# Patient Record
Sex: Female | Born: 1950 | Race: Black or African American | Hispanic: No | Marital: Married
Health system: Southern US, Community
[De-identification: ages and names within clinical notes are randomized; demographics above are authoritative.]

---

## 2017-07-15 ENCOUNTER — Other Ambulatory Visit (HOSPITAL_COMMUNITY): Payer: Medicaid Other

## 2017-07-15 ENCOUNTER — Inpatient Hospital Stay
Admission: RE | Admit: 2017-07-15 | Discharge: 2017-08-11 | Disposition: E | Payer: Medicaid Other | Attending: Internal Medicine | Admitting: Internal Medicine

## 2017-07-15 DIAGNOSIS — K859 Acute pancreatitis without necrosis or infection, unspecified: Secondary | ICD-10-CM

## 2017-07-15 DIAGNOSIS — Z431 Encounter for attention to gastrostomy: Secondary | ICD-10-CM

## 2017-07-15 DIAGNOSIS — R059 Cough, unspecified: Secondary | ICD-10-CM

## 2017-07-15 DIAGNOSIS — R609 Edema, unspecified: Secondary | ICD-10-CM

## 2017-07-15 DIAGNOSIS — Z4659 Encounter for fitting and adjustment of other gastrointestinal appliance and device: Secondary | ICD-10-CM

## 2017-07-15 DIAGNOSIS — J9690 Respiratory failure, unspecified, unspecified whether with hypoxia or hypercapnia: Secondary | ICD-10-CM

## 2017-07-15 DIAGNOSIS — L0291 Cutaneous abscess, unspecified: Secondary | ICD-10-CM

## 2017-07-15 DIAGNOSIS — R05 Cough: Secondary | ICD-10-CM

## 2017-07-15 DIAGNOSIS — Z931 Gastrostomy status: Secondary | ICD-10-CM

## 2017-07-15 LAB — BLOOD GAS, ARTERIAL
ACID-BASE EXCESS: 1.4 mmol/L (ref 0.0–2.0)
BICARBONATE: 24.6 mmol/L (ref 20.0–28.0)
O2 CONTENT: 3 L/min
O2 Saturation: 99 %
PCO2 ART: 33.1 mmHg (ref 32.0–48.0)
Patient temperature: 98.6
pH, Arterial: 7.484 — ABNORMAL HIGH (ref 7.350–7.450)
pO2, Arterial: 153 mmHg — ABNORMAL HIGH (ref 83.0–108.0)

## 2017-07-15 LAB — PROTIME-INR
INR: 1.27
Prothrombin Time: 15.8 seconds — ABNORMAL HIGH (ref 11.4–15.2)

## 2017-07-16 ENCOUNTER — Institutional Professional Consult (permissible substitution) (HOSPITAL_COMMUNITY): Payer: Medicaid Other

## 2017-07-16 LAB — CBC WITH DIFFERENTIAL/PLATELET
Abs Immature Granulocytes: 0.1 10*3/uL (ref 0.0–0.1)
BASOS ABS: 0 10*3/uL (ref 0.0–0.1)
BASOS PCT: 0 %
EOS PCT: 0 %
Eosinophils Absolute: 0 10*3/uL (ref 0.0–0.7)
HCT: 36.6 % (ref 36.0–46.0)
Hemoglobin: 12.5 g/dL (ref 12.0–15.0)
Immature Granulocytes: 1 %
Lymphocytes Relative: 3 %
Lymphs Abs: 0.4 10*3/uL — ABNORMAL LOW (ref 0.7–4.0)
MCH: 29.5 pg (ref 26.0–34.0)
MCHC: 34.2 g/dL (ref 30.0–36.0)
MCV: 86.3 fL (ref 78.0–100.0)
MONO ABS: 0.6 10*3/uL (ref 0.1–1.0)
MONOS PCT: 4 %
Neutro Abs: 11.9 10*3/uL — ABNORMAL HIGH (ref 1.7–7.7)
Neutrophils Relative %: 92 %
PLATELETS: 99 10*3/uL — AB (ref 150–400)
RBC: 4.24 MIL/uL (ref 3.87–5.11)
RDW: 19.9 % — ABNORMAL HIGH (ref 11.5–15.5)
WBC: 13.1 10*3/uL — ABNORMAL HIGH (ref 4.0–10.5)

## 2017-07-16 LAB — COMPREHENSIVE METABOLIC PANEL
ALBUMIN: 2.8 g/dL — AB (ref 3.5–5.0)
ALT: 19 U/L (ref 0–44)
ANION GAP: 18 — AB (ref 5–15)
AST: 49 U/L — ABNORMAL HIGH (ref 15–41)
Alkaline Phosphatase: 60 U/L (ref 38–126)
BILIRUBIN TOTAL: 3.7 mg/dL — AB (ref 0.3–1.2)
BUN: 53 mg/dL — ABNORMAL HIGH (ref 8–23)
CO2: 22 mmol/L (ref 22–32)
Calcium: 8.4 mg/dL — ABNORMAL LOW (ref 8.9–10.3)
Chloride: 103 mmol/L (ref 98–111)
Creatinine, Ser: 4.7 mg/dL — ABNORMAL HIGH (ref 0.44–1.00)
GFR calc Af Amer: 10 mL/min — ABNORMAL LOW (ref >60)
GFR calc non Af Amer: 9 mL/min — ABNORMAL LOW (ref >60)
GLUCOSE: 88 mg/dL (ref 70–99)
POTASSIUM: 5.8 mmol/L — AB (ref 3.5–5.1)
SODIUM: 143 mmol/L (ref 135–145)
TOTAL PROTEIN: 6 g/dL — AB (ref 6.5–8.1)

## 2017-07-16 LAB — LIPASE, BLOOD: LIPASE: 369 U/L — AB (ref 11–51)

## 2017-07-16 LAB — TRIGLYCERIDES: Triglycerides: 122 mg/dL (ref <150)

## 2017-07-16 LAB — HEMOGLOBIN A1C
Hgb A1c MFr Bld: 5.8 % — ABNORMAL HIGH (ref 4.8–5.6)
MEAN PLASMA GLUCOSE: 119.76 mg/dL

## 2017-07-16 LAB — C DIFFICILE QUICK SCREEN W PCR REFLEX
C DIFFICILE (CDIFF) TOXIN: NEGATIVE
C Diff antigen: POSITIVE — AB

## 2017-07-16 LAB — CLOSTRIDIUM DIFFICILE BY PCR, REFLEXED: Toxigenic C. Difficile by PCR: POSITIVE — AB

## 2017-07-16 LAB — TSH: TSH: 1.328 u[IU]/mL (ref 0.350–4.500)

## 2017-07-16 LAB — T4, FREE: FREE T4: 0.95 ng/dL (ref 0.82–1.77)

## 2017-07-16 LAB — AMYLASE: Amylase: 641 U/L — ABNORMAL HIGH (ref 28–100)

## 2017-07-16 LAB — POTASSIUM: POTASSIUM: 5.5 mmol/L — AB (ref 3.5–5.1)

## 2017-07-17 LAB — RENAL FUNCTION PANEL
ALBUMIN: 2.5 g/dL — AB (ref 3.5–5.0)
Anion gap: 13 (ref 5–15)
BUN: 64 mg/dL — AB (ref 8–23)
CO2: 26 mmol/L (ref 22–32)
CREATININE: 5.56 mg/dL — AB (ref 0.44–1.00)
Calcium: 8.2 mg/dL — ABNORMAL LOW (ref 8.9–10.3)
Chloride: 104 mmol/L (ref 98–111)
GFR calc Af Amer: 8 mL/min — ABNORMAL LOW (ref >60)
GFR calc non Af Amer: 7 mL/min — ABNORMAL LOW (ref >60)
GLUCOSE: 96 mg/dL (ref 70–99)
PHOSPHORUS: 5.4 mg/dL — AB (ref 2.5–4.6)
Potassium: 4.6 mmol/L (ref 3.5–5.1)
SODIUM: 143 mmol/L (ref 135–145)

## 2017-07-17 LAB — CBC
HCT: 34 % — ABNORMAL LOW (ref 36.0–46.0)
Hemoglobin: 11.1 g/dL — ABNORMAL LOW (ref 12.0–15.0)
MCH: 28.2 pg (ref 26.0–34.0)
MCHC: 32.6 g/dL (ref 30.0–36.0)
MCV: 86.3 fL (ref 78.0–100.0)
PLATELETS: 137 10*3/uL — AB (ref 150–400)
RBC: 3.94 MIL/uL (ref 3.87–5.11)
RDW: 20.4 % — AB (ref 11.5–15.5)
WBC: 13 10*3/uL — ABNORMAL HIGH (ref 4.0–10.5)

## 2017-07-17 LAB — MAGNESIUM: MAGNESIUM: 2 mg/dL (ref 1.7–2.4)

## 2017-07-18 LAB — CBC
HEMATOCRIT: 34.3 % — AB (ref 36.0–46.0)
HEMOGLOBIN: 11.5 g/dL — AB (ref 12.0–15.0)
MCH: 28.9 pg (ref 26.0–34.0)
MCHC: 33.5 g/dL (ref 30.0–36.0)
MCV: 86.2 fL (ref 78.0–100.0)
Platelets: 147 10*3/uL — ABNORMAL LOW (ref 150–400)
RBC: 3.98 MIL/uL (ref 3.87–5.11)
RDW: 19.8 % — ABNORMAL HIGH (ref 11.5–15.5)
WBC: 12.5 10*3/uL — ABNORMAL HIGH (ref 4.0–10.5)

## 2017-07-18 LAB — RENAL FUNCTION PANEL
ANION GAP: 15 (ref 5–15)
Albumin: 2.6 g/dL — ABNORMAL LOW (ref 3.5–5.0)
BUN: 30 mg/dL — ABNORMAL HIGH (ref 8–23)
CHLORIDE: 98 mmol/L (ref 98–111)
CO2: 25 mmol/L (ref 22–32)
Calcium: 8.8 mg/dL — ABNORMAL LOW (ref 8.9–10.3)
Creatinine, Ser: 4.43 mg/dL — ABNORMAL HIGH (ref 0.44–1.00)
GFR calc non Af Amer: 9 mL/min — ABNORMAL LOW (ref >60)
GFR, EST AFRICAN AMERICAN: 11 mL/min — AB (ref >60)
GLUCOSE: 83 mg/dL (ref 70–99)
POTASSIUM: 3.9 mmol/L (ref 3.5–5.1)
Phosphorus: 4.3 mg/dL (ref 2.5–4.6)
Sodium: 138 mmol/L (ref 135–145)

## 2017-07-18 LAB — HEPATITIS B SURFACE ANTIBODY,QUALITATIVE: Hep B S Ab: REACTIVE

## 2017-07-18 LAB — HEPATITIS B CORE ANTIBODY, TOTAL: HEP B C TOTAL AB: NEGATIVE

## 2017-07-18 LAB — HEPATITIS B SURFACE ANTIGEN: HEP B S AG: NEGATIVE

## 2017-07-18 MED ORDER — GENERIC EXTERNAL MEDICATION
5.00 | Status: DC
Start: ? — End: 2017-07-18

## 2017-07-18 MED ORDER — GENERIC EXTERNAL MEDICATION
1.00 | Status: DC
Start: ? — End: 2017-07-18

## 2017-07-18 MED ORDER — GENERIC EXTERNAL MEDICATION
5.00 | Status: DC
Start: 2017-07-15 — End: 2017-07-18

## 2017-07-18 MED ORDER — BISACODYL 5 MG PO TBEC
10.00 | DELAYED_RELEASE_TABLET | ORAL | Status: DC
Start: ? — End: 2017-07-18

## 2017-07-18 MED ORDER — GENERIC EXTERNAL MEDICATION
2.25 g | Status: DC
Start: 2017-07-15 — End: 2017-07-18

## 2017-07-18 MED ORDER — ACETAMINOPHEN 325 MG PO TABS
650.00 | ORAL_TABLET | ORAL | Status: DC
Start: ? — End: 2017-07-18

## 2017-07-18 MED ORDER — ONDANSETRON HCL 4 MG/2ML IJ SOLN
4.00 | INTRAMUSCULAR | Status: DC
Start: ? — End: 2017-07-18

## 2017-07-18 MED ORDER — DEXTROSE 50 % IV SOLN
12.00 g | INTRAVENOUS | Status: DC
Start: ? — End: 2017-07-18

## 2017-07-18 MED ORDER — GLUCOSE 40 % PO GEL
15.00 g | ORAL | Status: DC
Start: ? — End: 2017-07-18

## 2017-07-18 MED ORDER — DOCUSATE SODIUM 100 MG PO CAPS
100.00 | ORAL_CAPSULE | ORAL | Status: DC
Start: ? — End: 2017-07-18

## 2017-07-18 MED ORDER — FENTANYL CITRATE (PF) 50 MCG/ML IJ SOLN
25.00 | INTRAMUSCULAR | Status: DC
Start: ? — End: 2017-07-18

## 2017-07-18 MED ORDER — LEVETIRACETAM 250 MG PO TABS
250.00 | ORAL_TABLET | ORAL | Status: DC
Start: 2017-07-15 — End: 2017-07-18

## 2017-07-18 MED ORDER — LEVOTHYROXINE SODIUM 50 MCG PO TABS
50.00 | ORAL_TABLET | ORAL | Status: DC
Start: 2017-07-16 — End: 2017-07-18

## 2017-07-18 MED ORDER — HYDROCORTISONE NA SUCCINATE PF 100 MG IJ SOLR
50.00 | INTRAMUSCULAR | Status: DC
Start: 2017-07-16 — End: 2017-07-18

## 2017-07-18 NOTE — Consult Note (Signed)
[facility] CONSULT NOTE    Date: 07/18/2017                  Patient Name:  Sara Alexander  MRN: [id number]  DOB: April 25, 1950  Age / Sex: 67 y.o., female         PCP: Default, Provider, MD                 Service Requesting Consult: Hospitalist                 Reason for Consult: Evaluation and management of ESRD            History of Present Illness: Patient is a 67 y.o. female with a PMHx of ESRD on HD TTS, hypertension, pancreatitis, hypoxic ischemic encephalopathy, ischemic cardiomyopathy ejection fraction 25%, severe aortic stenosis, coronary artery disease, recent acute respiratory failure requiring temporary mechanical ventilation, who was [facility] care.  She was recently [facility] to [facility] on July 05, 2017 and discharged on July 15, 2017.  She was admitted for mild sigmoid diverticulitis.  She was originally admitted and started on ciprofloxacin and Flagyl.  She subsequently had a lactic acid that was high at 8.8.  She was started on IV fluid hydration.  Unfortunately she suffered a PEA arrest on July 05, 2017.  She was resuscitated for approximately 10 minutes and then transferred to the intensive care unit.  EEG was performed while she was in the intensive care which showed generalized slowing without evidence of seizure.  Thereafter her mentation slightly improved.  She was then transferred here for ongoing care.  Patient did receive dialysis yesterday.   Medications: Carvedilol 6.25 mg twice daily, levothyroxine 50 mcg daily, discharge medications: Zosyn 2.25 g IV every 8 hours    Allergies: No known drug allergies   Past Medical History:  ESRD on HD TTS, hypertension, pancreatitis, hypoxic ischemic encephalopathy, ischemic cardiomyopathy ejection fraction 25%, severe aortic stenosis, coronary artery disease, recent acute respiratory failure requiring temporary mechanical  ventilation  Past Surgical History: Catheter insertion, pericardial window, breast biopsy, hysterectomy, colonoscopy  Family History: Unable to obtain from patient given altered mental status.  Social History: Unable to obtain from patient given altered mental status  Review of Systems: Unable to obtain from patient given altered mental status  Vital Signs: Temperature 98.6 pulse 89 respirations 16 blood pressure 114/68  Physical Exam: General: NAD, laying in bed  Head: Normocephalic, atraumatic.  Eyes: Anicteric, EOMI  Nose: Mucous membranes moist, not inflammed, nonerythematous.  Throat: Oropharynx nonerythematous, no exudate appreciated.   Neck: Supple, trachea midline.  Lungs:  Normal respiratory effort. Clear to auscultation BL without crackles or wheezes.  Heart: S1S2 2/6 SEM.  Abdomen:  BS normoactive. Soft, Nondistended, non-tender.  No masses or organomegaly.  Extremities: trace pretibial edema.  Neurologic: Awake but not following commands  Skin: No visible rashes, scars.    Lab results: Basic Metabolic Panel: Recent Labs  Lab 07/16/17 0917 07/16/17 2210 07/17/17 1024 07/17/17 1254  NA 143  --  143  --   K 5.8* 5.5* 4.6  --   CL 103  --  104  --   CO2 22  --  26  --   GLUCOSE 88  --  96  --   BUN 53*  --  64*  --   CREATININE 4.70*  --  5.56*  --   CALCIUM 8.4*  --  8.2*  --  MG  --   --   --  2.0  PHOS  --   --  5.4*  --     Liver Function Tests: Recent Labs  Lab 07/16/17 0917 07/17/17 1024  AST 49*  --   ALT 19  --   ALKPHOS 60  --   BILITOT 3.7*  --   PROT 6.0*  --   ALBUMIN 2.8* 2.5*   Recent Labs  Lab 07/16/17 0534  LIPASE 369*  AMYLASE 641*   No results for input(s): AMMONIA in the last 168 hours.  CBC: Recent Labs  Lab 07/16/17 0534 07/17/17 0850  WBC 13.1* 13.0*  NEUTROABS 11.9*  --   HGB 12.5 11.1*  HCT 36.6 34.0*  MCV 86.3 86.3  PLT 99* 137*    Cardiac Enzymes: No results for input(s): CKTOTAL, CKMB, CKMBINDEX,  TROPONINI in the last 168 hours.  BNP: Invalid input(s): POCBNP  CBG: No results for input(s): GLUCAP in the last 168 hours.  Microbiology: Results for orders placed or performed during the hospital encounter of 07/23/2017  C difficile quick scan w PCR reflex     Status: Abnormal   Collection Time: 08/10/2017  5:57 AM  Result Value Ref Range Status   C Diff antigen POSITIVE (A) NEGATIVE Final   C Diff toxin NEGATIVE NEGATIVE Final   C Diff interpretation Results are indeterminate. See PCR results.  Final    Comment: Performed at [facility], [address] [place], [place] [address]  C. Diff by PCR, Reflexed     Status: Abnormal   Collection Time: 08/03/2017  5:57 AM  Result Value Ref Range Status   Toxigenic C. Difficile by PCR POSITIVE (A) NEGATIVE Final    Comment: Positive for toxigenic C. difficile with little to no toxin production. Only treat if clinical presentation suggests symptomatic illness. Performed at [facility], [address] [place], [place] [address]     Coagulation Studies: Recent Labs    07/26/2017 06-12-10  LABPROT 15.8*  INR 1.27    Urinalysis: No results for input(s): COLORURINE, LABSPEC, PHURINE, GLUCOSEU, HGBUR, BILIRUBINUR, KETONESUR, PROTEINUR, UROBILINOGEN, NITRITE, LEUKOCYTESUR in the last 72 hours.  Invalid input(s): APPERANCEUR    Imaging:  No results found.   Assessment & Plan: Pt is a 67 y.o. female with a PMHx of ESRD on HD TTS, hypertension, pancreatitis, hypoxic ischemic encephalopathy due to PEA cardiac arrest, ischemic cardiomyopathy ejection fraction 25%, severe aortic stenosis, coronary artery disease, recent acute respiratory failure requiring temporary mechanical ventilation, who was [facility] care.  1.  ESRD on HD.  Patient was on dialysis on TTS as an outpatient.  We will switch her to a Monday, 06-12-2022, Friday schedule for nurse scheduling purposes.  She did have some  dialysis yesterday.  We will plan for a short dialysis treatment today and then her next treatment will be on 06-12-2022.  2.  Anemia of chronic kidney disease.  Hemoglobin currently 11.1.  Hold off on Epogen at this time.  3.  Secondary hyperparathyroidism.  We will plan to obtain PTH, phosphorus, calcium values over the course of his hospitalization.  4.  Recent PEA arrest with ischemic hypoxic encephalopathy.  Per records from [facility] appears in the patient has shown some improvement.  We will continue to monitor mental status.

## 2017-07-19 ENCOUNTER — Institutional Professional Consult (permissible substitution) (HOSPITAL_COMMUNITY): Payer: Medicaid Other

## 2017-07-19 LAB — MAGNESIUM: MAGNESIUM: 1.5 mg/dL — AB (ref 1.7–2.4)

## 2017-07-19 LAB — AMYLASE: Amylase: 227 U/L — ABNORMAL HIGH (ref 28–100)

## 2017-07-19 LAB — LIPASE, BLOOD: Lipase: 135 U/L — ABNORMAL HIGH (ref 11–51)

## 2017-07-19 LAB — HEPATITIS B SURFACE ANTIBODY,QUALITATIVE: HEP B S AB: REACTIVE

## 2017-07-19 LAB — HEPATITIS B SURFACE ANTIGEN: HEP B S AG: NEGATIVE

## 2017-07-19 LAB — PTH, INTACT AND CALCIUM
Calcium, Total (PTH): 8.8 mg/dL (ref 8.7–10.3)
PTH: 466 pg/mL — ABNORMAL HIGH (ref 15–65)

## 2017-07-19 LAB — HEPATITIS B CORE ANTIBODY, TOTAL: HEP B C TOTAL AB: NEGATIVE

## 2017-07-20 LAB — RENAL FUNCTION PANEL
Albumin: 2.4 g/dL — ABNORMAL LOW (ref 3.5–5.0)
Anion gap: 14 (ref 5–15)
BUN: 29 mg/dL — ABNORMAL HIGH (ref 8–23)
CO2: 23 mmol/L (ref 22–32)
Calcium: 8.6 mg/dL — ABNORMAL LOW (ref 8.9–10.3)
Chloride: 96 mmol/L — ABNORMAL LOW (ref 98–111)
Creatinine, Ser: 4.76 mg/dL — ABNORMAL HIGH (ref 0.44–1.00)
GFR calc Af Amer: 10 mL/min — ABNORMAL LOW
GFR calc non Af Amer: 9 mL/min — ABNORMAL LOW
Glucose, Bld: 234 mg/dL — ABNORMAL HIGH (ref 70–99)
Phosphorus: 3.9 mg/dL (ref 2.5–4.6)
Potassium: 3.5 mmol/L (ref 3.5–5.1)
Sodium: 133 mmol/L — ABNORMAL LOW (ref 135–145)

## 2017-07-20 LAB — CBC
HEMATOCRIT: 34.9 % — AB (ref 36.0–46.0)
Hemoglobin: 11.8 g/dL — ABNORMAL LOW (ref 12.0–15.0)
MCH: 28.8 pg (ref 26.0–34.0)
MCHC: 33.8 g/dL (ref 30.0–36.0)
MCV: 85.1 fL (ref 78.0–100.0)
Platelets: 159 10*3/uL (ref 150–400)
RBC: 4.1 MIL/uL (ref 3.87–5.11)
RDW: 20.2 % — ABNORMAL HIGH (ref 11.5–15.5)
WBC: 10.8 10*3/uL — ABNORMAL HIGH (ref 4.0–10.5)

## 2017-07-20 LAB — AMYLASE: Amylase: 202 U/L — ABNORMAL HIGH (ref 28–100)

## 2017-07-20 LAB — MAGNESIUM: Magnesium: 2.4 mg/dL (ref 1.7–2.4)

## 2017-07-20 LAB — LIPASE, BLOOD: Lipase: 144 U/L — ABNORMAL HIGH (ref 11–51)

## 2017-07-20 NOTE — Progress Notes (Signed)
[facility]  ROUNDING NOTE   Subjective:  Patient completed hemodialysis today. Tolerated well. Family at bedside.   Objective:  Vital signs in last 24 hours:  Temperature 97.4 pulse 87 respirations 30 blood pressure 131/63  Physical Exam: General: No acute distress  Head: Normocephalic, atraumatic. Moist oral mucosal membranes  Eyes: Anicteric  Neck: Supple, trachea midline  Lungs:  Clear to auscultation, normal effort  Heart: S1S2 no rubs  Abdomen:  Soft, nontender, bowel sounds present  Extremities: Trace peripheral edema.  Neurologic: Awake, moving upper extremeties, but not following commands  Skin: No rashes  Access: R IJ PC    Basic Metabolic Panel: Recent Labs  Lab 07/16/17 0917 07/16/17 2210 07/17/17 1024 07/17/17 1254 07/18/17 1800 07/19/17 0701 07/20/17 0810  NA 143  --  143  --  138  --  133*  K 5.8* 5.5* 4.6  --  3.9  --  3.5  CL 103  --  104  --  98  --  96*  CO2 22  --  26  --  25  --  23  GLUCOSE 88  --  96  --  83  --  234*  BUN 53*  --  64*  --  30*  --  29*  CREATININE 4.70*  --  5.56*  --  4.43*  --  4.76*  CALCIUM 8.4*  --  8.2*  --  8.8*  8.8  --  8.6*  MG  --   --   --  2.0  --  1.5* 2.4  PHOS  --   --  5.4*  --  4.3  --  3.9    Liver Function Tests: Recent Labs  Lab 07/16/17 0917 07/17/17 1024 07/18/17 1800 07/20/17 0810  AST 49*  --   --   --   ALT 19  --   --   --   ALKPHOS 60  --   --   --   BILITOT 3.7*  --   --   --   PROT 6.0*  --   --   --   ALBUMIN 2.8* 2.5* 2.6* 2.4*   Recent Labs  Lab 07/16/17 0534 07/19/17 0701 07/20/17 0810  LIPASE 369* 135* 144*  AMYLASE 641* 227* 202*   No results for input(s): AMMONIA in the last 168 hours.  CBC: Recent Labs  Lab 07/16/17 0534 07/17/17 0850 07/18/17 1800 07/20/17 0620  WBC 13.1* 13.0* 12.5* 10.8*  NEUTROABS 11.9*  --   --   --   HGB 12.5 11.1* 11.5* 11.8*  HCT 36.6 34.0* 34.3* 34.9*  MCV 86.3 86.3 86.2 85.1  PLT 99* 137* 147* 159    Cardiac  Enzymes: No results for input(s): CKTOTAL, CKMB, CKMBINDEX, TROPONINI in the last 168 hours.  BNP: Invalid input(s): POCBNP  CBG: No results for input(s): GLUCAP in the last 168 hours.  Microbiology: Results for orders placed or performed during the hospital encounter of 07/22/2017  C difficile quick scan w PCR reflex     Status: Abnormal   Collection Time: 07/17/2017  5:57 AM  Result Value Ref Range Status   C Diff antigen POSITIVE (A) NEGATIVE Final   C Diff toxin NEGATIVE NEGATIVE Final   C Diff interpretation Results are indeterminate. See PCR results.  Final    Comment: Performed at [facility], [address], [place], [place] [address]  C. Diff by PCR, Reflexed     Status: Abnormal   Collection Time:  12-29-2017  5:57 AM  Result Value Ref Range Status   Toxigenic C. Difficile by PCR POSITIVE (A) NEGATIVE Final    Comment: Positive for toxigenic C. difficile with little to no toxin production. Only treat if clinical presentation suggests symptomatic illness. Performed at [facility], [address] [place], [place] [id number]     Coagulation Studies: No results for input(s): LABPROT, INR in the last 72 hours.  Urinalysis: No results for input(s): COLORURINE, LABSPEC, PHURINE, GLUCOSEU, HGBUR, BILIRUBINUR, KETONESUR, PROTEINUR, UROBILINOGEN, NITRITE, LEUKOCYTESUR in the last 72 hours.  Invalid input(s): APPERANCEUR    Imaging: Dg Abd Portable 1v  Result Date: 07/19/2017 CLINICAL DATA:  Esophagogastric tube placement. EXAM: PORTABLE ABDOMEN - 1 VIEW COMPARISON:  Abdominal radiograph of July 16, 2017 FINDINGS: An esophagogastric tube is present. Its tip in proximal port project in the gastric body. The bowel gas pattern is normal. IMPRESSION: Reasonable positioning of the esophagogastric tube. Electronically Signed   By: David  SwazilandJordan M.D.   On: 07/19/2017 12:26     Medications:       Assessment/ Plan:  67 y.o. female with a PMHx of ESRD on HD TTS,  hypertension, pancreatitis, hypoxic ischemic encephalopathy due to PEA cardiac arrest, ischemic cardiomyopathy ejection fraction 25%, severe aortic stenosis, coronary artery disease, recent acute respiratory failure requiring temporary mechanical ventilation, who was [facility].  1.  ESRD on HD.  Patient was on dialysis on TTS as an outpatient.   -Patient seen post dialysis.  She tolerated well.  Next dialysis on Friday.  We are continuing dialysis on MWF schedule here.  2.  Anemia of chronic kidney disease.    Hemoglobin currently 11.8.  Hold off on Epogen at this time.  3.  Secondary hyperparathyroidism.    Phosphorus 3.9 and acceptable.  4.  Recent PEA arrest with ischemic hypoxic encephalopathy.  Patient awake and moving upper extremities.  However not following commands.  Overall improved from her recent hospitalization however.     LOS: 0 Branndon Tuite 7/10/20194:52 PM

## 2017-07-22 LAB — CBC
HCT: 36.3 % (ref 36.0–46.0)
Hemoglobin: 12.1 g/dL (ref 12.0–15.0)
MCH: 28.9 pg (ref 26.0–34.0)
MCHC: 33.3 g/dL (ref 30.0–36.0)
MCV: 86.6 fL (ref 78.0–100.0)
PLATELETS: 173 10*3/uL (ref 150–400)
RBC: 4.19 MIL/uL (ref 3.87–5.11)
RDW: 19.9 % — ABNORMAL HIGH (ref 11.5–15.5)
WBC: 9.1 10*3/uL (ref 4.0–10.5)

## 2017-07-22 LAB — MAGNESIUM: Magnesium: 2.5 mg/dL — ABNORMAL HIGH (ref 1.7–2.4)

## 2017-07-22 LAB — LIPASE, BLOOD: Lipase: 178 U/L — ABNORMAL HIGH (ref 11–51)

## 2017-07-22 LAB — RENAL FUNCTION PANEL
Albumin: 3 g/dL — ABNORMAL LOW (ref 3.5–5.0)
Anion gap: 16 — ABNORMAL HIGH (ref 5–15)
BUN: 27 mg/dL — AB (ref 8–23)
CHLORIDE: 97 mmol/L — AB (ref 98–111)
CO2: 24 mmol/L (ref 22–32)
CREATININE: 4.53 mg/dL — AB (ref 0.44–1.00)
Calcium: 9.6 mg/dL (ref 8.9–10.3)
GFR calc Af Amer: 10 mL/min — ABNORMAL LOW (ref >60)
GFR calc non Af Amer: 9 mL/min — ABNORMAL LOW (ref >60)
Glucose, Bld: 128 mg/dL — ABNORMAL HIGH (ref 70–99)
PHOSPHORUS: 3.4 mg/dL (ref 2.5–4.6)
Potassium: 3.9 mmol/L (ref 3.5–5.1)
Sodium: 137 mmol/L (ref 135–145)

## 2017-07-22 LAB — AMYLASE: Amylase: 183 U/L — ABNORMAL HIGH (ref 28–100)

## 2017-07-22 LAB — AMMONIA: Ammonia: 26 umol/L (ref 9–35)

## 2017-07-22 NOTE — Progress Notes (Signed)
[facility]  ROUNDING NOTE   Subjective:  Patient seen and evaluated during hemodialysis. Tolerating well. She is currently awake and attempts to verbalize.Speech difficult to comprehend.    Objective:  Vital signs in last 24 hours:  Genetics and pulse 92 respirations 35 blood pressure 135/74  Physical Exam: General: No acute distress  Head: Normocephalic, atraumatic. Moist oral mucosal membranes  Eyes: Anicteric  Neck: Supple, trachea midline  Lungs:  Clear to auscultation, normal effort  Heart: S1S2 no rubs  Abdomen:  Soft, nontender, bowel sounds present  Extremities: Trace peripheral edema.  Neurologic: Awake, moving upper extremeties, but not following commands  Skin: No rashes  Access: R IJ PC    Basic Metabolic Panel: Recent Labs  Lab 07/16/17 0917 07/16/17 2210 07/17/17 1024 07/17/17 1254 07/18/17 1800 07/19/17 0701 07/20/17 0810 07/22/17 0404  NA 143  --  143  --  138  --  133* 137  K 5.8* 5.5* 4.6  --  3.9  --  3.5 3.9  CL 103  --  104  --  98  --  96* 97*  CO2 22  --  26  --  25  --  23 24  GLUCOSE 88  --  96  --  83  --  234* 128*  BUN 53*  --  64*  --  30*  --  29* 27*  CREATININE 4.70*  --  5.56*  --  4.43*  --  4.76* 4.53*  CALCIUM 8.4*  --  8.2*  --  8.8*  8.8  --  8.6* 9.6  MG  --   --   --  2.0  --  1.5* 2.4 2.5*  PHOS  --   --  5.4*  --  4.3  --  3.9 3.4    Liver Function Tests: Recent Labs  Lab 07/16/17 0917 07/17/17 1024 07/18/17 1800 07/20/17 0810 07/22/17 0404  AST 49*  --   --   --   --   ALT 19  --   --   --   --   ALKPHOS 60  --   --   --   --   BILITOT 3.7*  --   --   --   --   PROT 6.0*  --   --   --   --   ALBUMIN 2.8* 2.5* 2.6* 2.4* 3.0*   Recent Labs  Lab 07/16/17 0534 07/19/17 0701 07/20/17 0810 07/22/17 0404  LIPASE 369* 135* 144* 178*  AMYLASE 641* 227* 202* 183*   Recent Labs  Lab 07/22/17 0404  AMMONIA 26    CBC: Recent Labs  Lab 07/16/17 0534 07/17/17 0850 07/18/17 1800  07/20/17 0620 07/22/17 0404  WBC 13.1* 13.0* 12.5* 10.8* 9.1  NEUTROABS 11.9*  --   --   --   --   HGB 12.5 11.1* 11.5* 11.8* 12.1  HCT 36.6 34.0* 34.3* 34.9* 36.3  MCV 86.3 86.3 86.2 85.1 86.6  PLT 99* 137* 147* 159 173    Cardiac Enzymes: No results for input(s): CKTOTAL, CKMB, CKMBINDEX, TROPONINI in the last 168 hours.  BNP: Invalid input(s): POCBNP  CBG: No results for input(s): GLUCAP in the last 168 hours.  Microbiology: Results for orders placed or performed during the hospital encounter of 07/31/2017  C difficile quick scan w PCR reflex     Status: Abnormal   Collection Time: 07/11/2017  5:57 AM  Result Value Ref Range Status   C Diff antigen POSITIVE (A)  NEGATIVE Final   C Diff toxin NEGATIVE NEGATIVE Final   C Diff interpretation Results are indeterminate. See PCR results.  Final    Comment: Performed at [facility], [address], [place], [place] [address]  C. Diff by PCR, Reflexed     Status: Abnormal   Collection Time: 07/22/2017  5:57 AM  Result Value Ref Range Status   Toxigenic C. Difficile by PCR POSITIVE (A) NEGATIVE Final    Comment: Positive for toxigenic C. difficile with little to no toxin production. Only treat if clinical presentation suggests symptomatic illness. Performed at [facility], [address] [place], [place] [address]     Coagulation Studies: No results for input(s): LABPROT, INR in the last 72 hours.  Urinalysis: No results for input(s): COLORURINE, LABSPEC, PHURINE, GLUCOSEU, HGBUR, BILIRUBINUR, KETONESUR, PROTEINUR, UROBILINOGEN, NITRITE, LEUKOCYTESUR in the last 72 hours.  Invalid input(s): APPERANCEUR    Imaging: No results found.   Medications:       Assessment/ Plan:  67 y.o. female with a PMHx of ESRD on HD TTS, hypertension, pancreatitis, hypoxic ischemic encephalopathy due to PEA cardiac arrest, ischemic cardiomyopathy ejection fraction 25%, severe aortic stenosis, coronary artery disease, recent  acute respiratory failure requiring temporary mechanical ventilation, who was [facility].  1.  ESRD on HD.  Patient was on dialysis on TTS as an outpatient.   -Patient seen during dialysis.  Tolerating well.  We plan to complete dialysis today and next else is to be scheduled for Monday.  2.  Anemia of chronic kidney disease.   hemoglobin 12.1.  No indication for Procrit.  3.  Secondary hyperparathyroidism.    Phosphorus currently 3.4 and at target.  4.  Recent PEA arrest with ischemic hypoxic encephalopathy. Patient continues to have slow but steady neurologic improvement.  She was attempting to verbalize today but speech was difficult to comprehend.     LOS: 0 Ayeden Gladman 7/12/20194:24 PM

## 2017-07-24 ENCOUNTER — Other Ambulatory Visit (HOSPITAL_COMMUNITY): Payer: Medicaid Other

## 2017-07-25 LAB — RENAL FUNCTION PANEL
ANION GAP: 12 (ref 5–15)
Albumin: 2.5 g/dL — ABNORMAL LOW (ref 3.5–5.0)
BUN: 44 mg/dL — AB (ref 8–23)
CHLORIDE: 95 mmol/L — AB (ref 98–111)
CO2: 29 mmol/L (ref 22–32)
Calcium: 9.4 mg/dL (ref 8.9–10.3)
Creatinine, Ser: 5.1 mg/dL — ABNORMAL HIGH (ref 0.44–1.00)
GFR calc Af Amer: 9 mL/min — ABNORMAL LOW (ref >60)
GFR calc non Af Amer: 8 mL/min — ABNORMAL LOW (ref >60)
Glucose, Bld: 105 mg/dL — ABNORMAL HIGH (ref 70–99)
PHOSPHORUS: 3.4 mg/dL (ref 2.5–4.6)
POTASSIUM: 4.1 mmol/L (ref 3.5–5.1)
Sodium: 136 mmol/L (ref 135–145)

## 2017-07-25 LAB — CBC
HEMATOCRIT: 29.8 % — AB (ref 36.0–46.0)
HEMOGLOBIN: 10.1 g/dL — AB (ref 12.0–15.0)
MCH: 28.8 pg (ref 26.0–34.0)
MCHC: 33.9 g/dL (ref 30.0–36.0)
MCV: 84.9 fL (ref 78.0–100.0)
Platelets: 141 10*3/uL — ABNORMAL LOW (ref 150–400)
RBC: 3.51 MIL/uL — ABNORMAL LOW (ref 3.87–5.11)
RDW: 19 % — AB (ref 11.5–15.5)
WBC: 8 10*3/uL (ref 4.0–10.5)

## 2017-07-25 NOTE — Progress Notes (Signed)
[facility]  ROUNDING NOTE   Subjective:  Patient seen during dialysis. Tolerating well. Blood flow rate 400. Ultrafiltration target 1.5 kg.    Objective:  Vital signs in last 24 hours:  Temperature 97.1 pulse 87 respirations 32 blood pressure 104/57  Physical Exam: General: No acute distress  Head: Normocephalic, atraumatic. Moist oral mucosal membranes  Eyes: Anicteric  Neck: Supple, trachea midline  Lungs:  Clear to auscultation, normal effort  Heart: S1S2 no rubs  Abdomen:  Soft, nontender, bowel sounds present  Extremities: Trace peripheral edema.  Neurologic: Awake, moving upper extremeties, but not following commands  Skin: No rashes  Access: R IJ PC    Basic Metabolic Panel: Recent Labs  Lab 07/18/17 1800 07/19/17 0701 07/20/17 0810 07/22/17 0404 07/25/17 0500  NA 138  --  133* 137 136  K 3.9  --  3.5 3.9 4.1  CL 98  --  96* 97* 95*  CO2 25  --  23 24 29   GLUCOSE 83  --  234* 128* 105*  BUN 30*  --  29* 27* 44*  CREATININE 4.43*  --  4.76* 4.53* 5.10*  CALCIUM 8.8*  8.8  --  8.6* 9.6 9.4  MG  --  1.5* 2.4 2.5*  --   PHOS 4.3  --  3.9 3.4 3.4    Liver Function Tests: Recent Labs  Lab 07/18/17 1800 07/20/17 0810 07/22/17 0404 07/25/17 0500  ALBUMIN 2.6* 2.4* 3.0* 2.5*   Recent Labs  Lab 07/19/17 0701 07/20/17 0810 07/22/17 0404  LIPASE 135* 144* 178*  AMYLASE 227* 202* 183*   Recent Labs  Lab 07/22/17 0404  AMMONIA 26    CBC: Recent Labs  Lab 07/18/17 1800 07/20/17 0620 07/22/17 0404 07/25/17 0500  WBC 12.5* 10.8* 9.1 8.0  HGB 11.5* 11.8* 12.1 10.1*  HCT 34.3* 34.9* 36.3 29.8*  MCV 86.2 85.1 86.6 84.9  PLT 147* 159 173 141*    Cardiac Enzymes: No results for input(s): CKTOTAL, CKMB, CKMBINDEX, TROPONINI in the last 168 hours.  BNP: Invalid input(s): POCBNP  CBG: No results for input(s): GLUCAP in the last 168 hours.  Microbiology: Results for orders placed or performed during the hospital encounter of  07/24/2017  C difficile quick scan w PCR reflex     Status: Abnormal   Collection Time: 08/07/2017  5:57 AM  Result Value Ref Range Status   C Diff antigen POSITIVE (A) NEGATIVE Final   C Diff toxin NEGATIVE NEGATIVE Final   C Diff interpretation Results are indeterminate. See PCR results.  Final    Comment: Performed at [facility], [address] [place], [place] [address]  C. Diff by PCR, Reflexed     Status: Abnormal   Collection Time: 07/12/2017  5:57 AM  Result Value Ref Range Status   Toxigenic C. Difficile by PCR POSITIVE (A) NEGATIVE Final    Comment: Positive for toxigenic C. difficile with little to no toxin production. Only treat if clinical presentation suggests symptomatic illness. Performed at [facility], [address], [place], [place] [address]     Coagulation Studies: No results for input(s): LABPROT, INR in the last 72 hours.  Urinalysis: No results for input(s): COLORURINE, LABSPEC, PHURINE, GLUCOSEU, HGBUR, BILIRUBINUR, KETONESUR, PROTEINUR, UROBILINOGEN, NITRITE, LEUKOCYTESUR in the last 72 hours.  Invalid input(s): APPERANCEUR    Imaging: [place] Chest Soft Tissue  Result Date: 07/24/2017 CLINICAL DATA:  Left chest lump. Negative left breast biopsy 4-6 months ago. EXAM: ULTRASOUND OF CHEST  SOFT TISSUES TECHNIQUE: Ultrasound examination of the chest wall soft tissues was performed in the area of clinical concern. COMPARISON:  None. FINDINGS: Focused ultrasound in the left anterolateral upper chest wall along the pre axillary line demonstrates a large 6.0 x 4.8 x 2.0 cm heterogeneous fluid collection with areas of internal hypoechogenicity next with anechoic fluid and multiple punctate linear internal echoes. No internal vascularity. There is left anterior chest wall soft tissue swelling inferior to this collection. IMPRESSION: Nonspecific 6.0 cm heterogeneous fluid collection in the left anterolateral upper chest wall. Hematoma or abscess could have this  appearance. Correlate with clinical symptoms, physical exam, and laboratory values to exclude infection. Electronically Signed   By: Obie DredgeWilliam T Derry M.D.   On: 07/24/2017 15:56     Medications:       Assessment/ Plan:  67 y.o. female with a PMHx of ESRD on HD TTS, hypertension, pancreatitis, hypoxic ischemic encephalopathy due to PEA cardiac arrest, ischemic cardiomyopathy ejection fraction 25%, severe aortic stenosis, coronary artery disease, recent acute respiratory failure requiring temporary mechanical ventilation, who [facility].  1.  ESRD on HD.  Patient was on dialysis on TTS as an outpatient.   -Patient seen and evaluated during dialysis.  Blood flow rate 400 with ultrafiltration target of 1.5.  Next dialysis on Wednesday.  2.  Anemia of chronic kidney disease.    Hemoglobin currently 10.1.  Continue to monitor.  If hemoglobin continues to drop consider adding Epogen.  3.  Secondary hyperparathyroidism.    Phosphorus remains acceptable at 3.4.  4.  Recent PEA arrest with ischemic hypoxic encephalopathy.  Patient is awake but not consistently following commands.     LOS: 0 Eudell Mcphee 7/15/20198:55 AM

## 2017-07-27 LAB — CBC
HCT: 30.8 % — ABNORMAL LOW (ref 36.0–46.0)
HEMOGLOBIN: 10.3 g/dL — AB (ref 12.0–15.0)
MCH: 28.5 pg (ref 26.0–34.0)
MCHC: 33.4 g/dL (ref 30.0–36.0)
MCV: 85.1 fL (ref 78.0–100.0)
PLATELETS: 136 10*3/uL — AB (ref 150–400)
RBC: 3.62 MIL/uL — ABNORMAL LOW (ref 3.87–5.11)
RDW: 19.4 % — ABNORMAL HIGH (ref 11.5–15.5)
WBC: 7.8 10*3/uL (ref 4.0–10.5)

## 2017-07-27 LAB — RENAL FUNCTION PANEL
Albumin: 2.7 g/dL — ABNORMAL LOW (ref 3.5–5.0)
Anion gap: 13 (ref 5–15)
BUN: 40 mg/dL — ABNORMAL HIGH (ref 8–23)
CALCIUM: 9.7 mg/dL (ref 8.9–10.3)
CHLORIDE: 93 mmol/L — AB (ref 98–111)
CO2: 26 mmol/L (ref 22–32)
CREATININE: 4.23 mg/dL — AB (ref 0.44–1.00)
GFR calc non Af Amer: 10 mL/min — ABNORMAL LOW (ref >60)
GFR, EST AFRICAN AMERICAN: 11 mL/min — AB (ref >60)
Glucose, Bld: 136 mg/dL — ABNORMAL HIGH (ref 70–99)
Phosphorus: 3.3 mg/dL (ref 2.5–4.6)
Potassium: 4.2 mmol/L (ref 3.5–5.1)
SODIUM: 132 mmol/L — AB (ref 135–145)

## 2017-07-27 LAB — MAGNESIUM: MAGNESIUM: 2.1 mg/dL (ref 1.7–2.4)

## 2017-07-27 NOTE — Progress Notes (Signed)
[facility]  ROUNDING NOTE   Subjective:  Patient due for hemodialysis later today. Resting comfortably in bed at the moment.  Objective:  Vital signs in last 24 hours:  Temperature 97 pulse 90 respirations 27 blood pressure 133/74  Physical Exam: General: No acute distress  Head: Normocephalic, atraumatic. Moist oral mucosal membranes  Eyes: Anicteric  Neck: Supple, trachea midline  Lungs:  Clear to auscultation, normal effort  Heart: S1S2 no rubs  Abdomen:  Soft, nontender, bowel sounds present  Extremities: Trace peripheral edema.  Neurologic: Awake, no tconsistenly following commands  Skin: No rashes  Access: R IJ PC    Basic Metabolic Panel: Recent Labs  Lab 07/22/17 0404 07/25/17 0500 07/27/17 0540  NA 137 136 132*  K 3.9 4.1 4.2  CL 97* 95* 93*  CO2 24 29 26   GLUCOSE 128* 105* 136*  BUN 27* 44* 40*  CREATININE 4.53* 5.10* 4.23*  CALCIUM 9.6 9.4 9.7  MG 2.5*  --   --   PHOS 3.4 3.4 3.3    Liver Function Tests: Recent Labs  Lab 07/22/17 0404 07/25/17 0500 07/27/17 0540  ALBUMIN 3.0* 2.5* 2.7*   Recent Labs  Lab 07/22/17 0404  LIPASE 178*  AMYLASE 183*   Recent Labs  Lab 07/22/17 0404  AMMONIA 26    CBC: Recent Labs  Lab 07/22/17 0404 07/25/17 0500 07/27/17 0540  WBC 9.1 8.0 7.8  HGB 12.1 10.1* 10.3*  HCT 36.3 29.8* 30.8*  MCV 86.6 84.9 85.1  PLT 173 141* 136*    Cardiac Enzymes: No results for input(s): CKTOTAL, CKMB, CKMBINDEX, TROPONINI in the last 168 hours.  BNP: Invalid input(s): POCBNP  CBG: No results for input(s): GLUCAP in the last 168 hours.  Microbiology: Results for orders placed or performed during the hospital encounter of 02-06-2017  C difficile quick scan w PCR reflex     Status: Abnormal   Collection Time: 02-06-2017  5:57 AM  Result Value Ref Range Status   C Diff antigen POSITIVE (A) NEGATIVE Final   C Diff toxin NEGATIVE NEGATIVE Final   C Diff interpretation Results are indeterminate. See  PCR results.  Final    Comment: Performed at [facility], [address] [place], [place] [address]  C. Diff by PCR, Reflexed     Status: Abnormal   Collection Time: 02-06-2017  5:57 AM  Result Value Ref Range Status   Toxigenic C. Difficile by PCR POSITIVE (A) NEGATIVE Final    Comment: Positive for toxigenic C. difficile with little to no toxin production. Only treat if clinical presentation suggests symptomatic illness. Performed at [facility], [address] [place], [place] [address]     Coagulation Studies: No results for input(s): LABPROT, INR in the last 72 hours.  Urinalysis: No results for input(s): COLORURINE, LABSPEC, PHURINE, GLUCOSEU, HGBUR, BILIRUBINUR, KETONESUR, PROTEINUR, UROBILINOGEN, NITRITE, LEUKOCYTESUR in the last 72 hours.  Invalid input(s): APPERANCEUR    Imaging: No results found.   Medications:       Assessment/ Plan:  67 y.o. female with a PMHx of ESRD on HD TTS, hypertension, pancreatitis, hypoxic ischemic encephalopathy due to PEA cardiac arrest, ischemic cardiomyopathy ejection fraction 25%, severe aortic stenosis, coronary artery disease, recent acute respiratory failure requiring temporary mechanical ventilation, who [facility].  1.  ESRD on HD.  Patient was on dialysis on TTS as an outpatient.   -patient due for hemodialysis today.  Orders have been prepared.  Continue dialysis on MWF schedule while here.  2.  Anemia of chronic kidney disease.    Hemoglobin 10.3 and acceptable.  Continue to monitor.  3.  Secondary hyperparathyroidism.    Phosphorus at target at 3.3.  Recheck phosphorus at next dialysis treatment.  4.  Recent PEA arrest with ischemic hypoxic encephalopathy.  Patient is awake but not consistently following commands.     LOS: 0 Aeden Matranga 7/17/201911:20 AM

## 2017-07-28 LAB — MAGNESIUM: Magnesium: 1.9 mg/dL (ref 1.7–2.4)

## 2017-07-29 LAB — CBC
HEMATOCRIT: 34.1 % — AB (ref 36.0–46.0)
Hemoglobin: 11.1 g/dL — ABNORMAL LOW (ref 12.0–15.0)
MCH: 28.8 pg (ref 26.0–34.0)
MCHC: 32.6 g/dL (ref 30.0–36.0)
MCV: 88.3 fL (ref 78.0–100.0)
PLATELETS: 174 10*3/uL (ref 150–400)
RBC: 3.86 MIL/uL — ABNORMAL LOW (ref 3.87–5.11)
RDW: 19.9 % — AB (ref 11.5–15.5)
WBC: 8.9 10*3/uL (ref 4.0–10.5)

## 2017-07-29 LAB — RENAL FUNCTION PANEL
ALBUMIN: 2.8 g/dL — AB (ref 3.5–5.0)
Anion gap: 16 — ABNORMAL HIGH (ref 5–15)
BUN: 38 mg/dL — AB (ref 8–23)
CALCIUM: 9.8 mg/dL (ref 8.9–10.3)
CO2: 25 mmol/L (ref 22–32)
Chloride: 91 mmol/L — ABNORMAL LOW (ref 98–111)
Creatinine, Ser: 3.87 mg/dL — ABNORMAL HIGH (ref 0.44–1.00)
GFR calc Af Amer: 13 mL/min — ABNORMAL LOW (ref >60)
GFR calc non Af Amer: 11 mL/min — ABNORMAL LOW (ref >60)
GLUCOSE: 129 mg/dL — AB (ref 70–99)
PHOSPHORUS: 3.1 mg/dL (ref 2.5–4.6)
Potassium: 4.5 mmol/L (ref 3.5–5.1)
SODIUM: 132 mmol/L — AB (ref 135–145)

## 2017-07-29 NOTE — Progress Notes (Signed)
[facility]  ROUNDING NOTE   Subjective:  Patient resting comfortably in bed. She is arousable but not consistently following commands. Due for dialysis today.  Objective:  Vital signs in last 24 hours:  Temperature 96.4 pulse 91 respirations 29 blood pressure 129/76  Physical Exam: General: No acute distress  Head: Normocephalic, atraumatic. Moist oral mucosal membranes  Eyes: Anicteric  Neck: Supple, trachea midline  Lungs:  Clear to auscultation, normal effort  Heart: S1S2 no rubs  Abdomen:  Soft, nontender, bowel sounds present  Extremities: Trace peripheral edema.  Neurologic: Awake, not consistently following commands.  Skin: No rashes  Access: R IJ PC    Basic Metabolic Panel: Recent Labs  Lab 07/25/17 0500 07/27/17 0540 07/28/17 0521 07/29/17 0620  NA 136 132*  --  132*  K 4.1 4.2  --  4.5  CL 95* 93*  --  91*  CO2 29 26  --  25  GLUCOSE 105* 136*  --  129*  BUN 44* 40*  --  38*  CREATININE 5.10* 4.23*  --  3.87*  CALCIUM 9.4 9.7  --  9.8  MG  --  2.1 1.9  --   PHOS 3.4 3.3  --  3.1    Liver Function Tests: Recent Labs  Lab 07/25/17 0500 07/27/17 0540 07/29/17 0620  ALBUMIN 2.5* 2.7* 2.8*   No results for input(s): LIPASE, AMYLASE in the last 168 hours. No results for input(s): AMMONIA in the last 168 hours.  CBC: Recent Labs  Lab 07/25/17 0500 07/27/17 0540 07/29/17 0620  WBC 8.0 7.8 8.9  HGB 10.1* 10.3* 11.1*  HCT 29.8* 30.8* 34.1*  MCV 84.9 85.1 88.3  PLT 141* 136* 174    Cardiac Enzymes: No results for input(s): CKTOTAL, CKMB, CKMBINDEX, TROPONINI in the last 168 hours.  BNP: Invalid input(s): POCBNP  CBG: No results for input(s): GLUCAP in the last 168 hours.  Microbiology: Results for orders placed or performed during the hospital encounter of 08/03/2017  C difficile quick scan w PCR reflex     Status: Abnormal   Collection Time: 08/03/2017  5:57 AM  Result Value Ref Range Status   C Diff antigen POSITIVE (A)  NEGATIVE Final   C Diff toxin NEGATIVE NEGATIVE Final   C Diff interpretation Results are indeterminate. See PCR results.  Final    Comment: Performed at [facility], [address] [place], [place] [address]  C. Diff by PCR, Reflexed     Status: Abnormal   Collection Time: 08/10/2017  5:57 AM  Result Value Ref Range Status   Toxigenic C. Difficile by PCR POSITIVE (A) NEGATIVE Final    Comment: Positive for toxigenic C. difficile with little to no toxin production. Only treat if clinical presentation suggests symptomatic illness. Performed at [facility], [address] [place], [place] [address]     Coagulation Studies: No results for input(s): LABPROT, INR in the last 72 hours.  Urinalysis: No results for input(s): COLORURINE, LABSPEC, PHURINE, GLUCOSEU, HGBUR, BILIRUBINUR, KETONESUR, PROTEINUR, UROBILINOGEN, NITRITE, LEUKOCYTESUR in the last 72 hours.  Invalid input(s): APPERANCEUR    Imaging: No results found.   Medications:       Assessment/ Plan:  67 y.o. 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  1.  ESRD on HD.  Patient was on dialysis on TTS as an outpatient.   -We plan for hemodialysis today.  Orders have been prepared.  Thereafter we will plan for the next dialysis session on Monday.  2.  Anemia of chronic kidney disease.    Hemoglobin up to 11.1.  Continue to monitor CBC.  3.  Secondary hyperparathyroidism.    Serum phosphorus was 3.1 this a.m.  Serum calcium also acceptable at 9.8.  Continue to monitor bone mineral metabolism parameters.  PTH also at target at 466.  4.  Recent PEA arrest with ischemic hypoxic encephalopathy.  No  significant change in mental status at the moment.     LOS: 0 Libero Puthoff 7/19/20198:46 AM

## 2017-07-31 ENCOUNTER — Other Ambulatory Visit (HOSPITAL_COMMUNITY): Payer: Medicaid Other

## 2017-08-01 ENCOUNTER — Encounter: Payer: Self-pay | Admitting: Radiology

## 2017-08-01 LAB — CBC
HEMATOCRIT: 45.1 % (ref 36.0–46.0)
HEMOGLOBIN: 14.7 g/dL (ref 12.0–15.0)
MCH: 28.1 pg (ref 26.0–34.0)
MCHC: 32.6 g/dL (ref 30.0–36.0)
MCV: 86.1 fL (ref 78.0–100.0)
Platelets: 154 10*3/uL (ref 150–400)
RBC: 5.24 MIL/uL — ABNORMAL HIGH (ref 3.87–5.11)
RDW: 21 % — ABNORMAL HIGH (ref 11.5–15.5)
WBC: 6.4 10*3/uL (ref 4.0–10.5)

## 2017-08-01 LAB — RENAL FUNCTION PANEL
ALBUMIN: 2.8 g/dL — AB (ref 3.5–5.0)
ANION GAP: 18 — AB (ref 5–15)
BUN: 56 mg/dL — ABNORMAL HIGH (ref 8–23)
CALCIUM: 10.2 mg/dL (ref 8.9–10.3)
CO2: 25 mmol/L (ref 22–32)
Chloride: 90 mmol/L — ABNORMAL LOW (ref 98–111)
Creatinine, Ser: 4.43 mg/dL — ABNORMAL HIGH (ref 0.44–1.00)
GFR calc Af Amer: 11 mL/min — ABNORMAL LOW (ref >60)
GFR, EST NON AFRICAN AMERICAN: 9 mL/min — AB (ref >60)
Glucose, Bld: 132 mg/dL — ABNORMAL HIGH (ref 70–99)
POTASSIUM: 4.9 mmol/L (ref 3.5–5.1)
Phosphorus: 2.8 mg/dL (ref 2.5–4.6)
SODIUM: 133 mmol/L — AB (ref 135–145)

## 2017-08-01 NOTE — Progress Notes (Signed)
Chief Complaint: Patient was seen in consultation today for G-tube placement at the request of Dr. Carron Curie  Referring Physician(s): Dr. Carron Curie  Supervising Physician: Richarda Overlie  Patient Status: [facility] - In-pt  History of Present Illness: Sara Alexander is a 67 y.o. female with a PMHx of ESRD on HD TTS, hypertension, pancreatitis, hypoxic ischemic encephalopathy, ischemic cardiomyopathy ejection fraction 25%, severe aortic stenosis, coronary artery disease, recent acute respiratory failure requiring temporary mechanical ventilation, who was [facility] ongoing care.  She was recently admitted to [facility] on July 05, 2017 and discharged on 07-16-17.  She was admitted for mild sigmoid diverticulitis.  Unfortunately she suffered a PEA arrest on July 05, 2017.  She was resuscitated for approximately 10 minutes and then transferred to the intensive care unit.  EEG was performed while she was in the intensive care which showed generalized slowing without evidence of seizure. She has ongoing altered mental status. She is on HD via (R)IJ Permcath M-W-F. She is receiving TF via NGT. IR is asked to eval for G-tube. PMHx, meds, labs, imaging, allergies reviewed.. D/W family via telephone    Allergies: Patient has no allergy information on record.     History reviewed. No pertinent family history.  Social History   Socioeconomic History  . Marital status: Married    Spouse name: Not on file  . Number of children: Not on file  . Years of education: Not on file  . Highest education level: Not on file  Occupational History  . Not on file  Social Needs  . Financial resource strain: Not on file  . Food insecurity:    Worry: Not on file    Inability: Not on file  . Transportation needs:    Medical: Not on file    Non-medical: Not on file  Tobacco Use  . Smoking status: Not on file  Substance and Sexual  Activity  . Alcohol use: Not on file  . Drug use: Not on file  . Sexual activity: Not on file  Lifestyle  . Physical activity:    Days per week: Not on file    Minutes per session: Not on file  . Stress: Not on file  Relationships  . Social connections:    Talks on phone: Not on file    Gets together: Not on file    Attends religious service: Not on file    Active member of club or organization: Not on file    Attends meetings of clubs or organizations: Not on file    Relationship status: Not on file  Other Topics Concern  . Not on file  Social History Narrative  . Not on file     Review of Systems: A 12 point ROS discussed and pertinent positives are indicated in the HPI above.  All other systems are negative.  Review of Systems  Vital Signs:   Physical Exam  Constitutional: She appears well-developed.  HENT:  Head: Normocephalic.  Mouth/Throat: Oropharynx is clear and moist.  NGT in nare  Neck: No JVD present.  Cardiovascular: Normal rate, regular rhythm and normal heart sounds.  Pulmonary/Chest: Effort normal and breath sounds normal. No respiratory distress.  Abdominal: Soft. She exhibits no distension and no mass. There is no tenderness.   Responds to name, but doesn't open eyes or follow other commands.     Imaging: Ct Abdomen Wo Contrast  Result Date: 08/01/2017 CLINICAL DATA:  67 year old with dysphasia. Evaluate anatomy for a percutaneous gastrostomy tube placement. EXAM: CT ABDOMEN WITHOUT CONTRAST TECHNIQUE: Multidetector CT imaging of the abdomen was performed following the standard protocol without IV contrast. COMPARISON:  CT 07/10/2017 FINDINGS: Lower chest: Heart is enlarged for size. Central venous catheter tip in the right atrium but incompletely evaluated. Small right pleural effusion with atelectasis in both lower lobes. Extensive subcutaneous edema in the left lower chest and breast region. Hepatobiliary: No gross abnormality to the liver or  gallbladder. There may be a small amount of perihepatic ascites. Pancreas: Unremarkable. No pancreatic ductal dilatation or surrounding inflammatory changes. Spleen: Normal in size without focal abnormality. Adrenals/Urinary Tract: Left nephrectomy. Evidence for cortical thinning in the right kidney with multiple exophytic low densities, suggestive for cysts. Few calcifications scattered throughout the right kidney that may be vascular and likely outside of the collecting system. Stomach/Bowel: Nasogastric tube terminates in the stomach. The transverse colon is situated caudal to the stomach body. No significant bowel dilatation. Vascular/Lymphatic: The aorta and visceral arteries are heavily calcified. No evidence for an aortic aneurysm. No significant lymph node enlargement in the abdomen. Other: Diffuse subcutaneous edema. Again noted is a catheter fragment along the right anterior abdominal wall. Musculoskeletal: Diffuse sclerosis of the bones findings likely secondary to end-stage renal disease. IMPRESSION: Anatomy is amendable for percutaneous gastrostomy tube placement. Subcutaneous edema with trace perihepatic ascites. Small right pleural effusion. Left nephrectomy.  Right kidney has multiple cysts. Electronically Signed   By: Richarda OverlieAdam  Henn M.D.   On: 08/01/2017 07:37   Dg Chest Port 1 View  Result Date: 08/02/2017 CLINICAL DATA:  Respiratory failure.  NG tube placement. EXAM: PORTABLE CHEST 1 VIEW COMPARISON:  07/14/2017 FINDINGS: Enteric tube is present. Tip is off the field of view. Right central venous catheter with tip over the right atrium. Cardiac enlargement with central pulmonary vascular congestion. Infiltration or atelectasis demonstrated in the right upper lung. This may indicate pneumonia. Probable left pleural effusion. No pneumothorax. Aortic calcification. Coronary artery stents. IMPRESSION: Cardiac enlargement with pulmonary vascular congestion. Infiltration in the right upper lung may  indicate pneumonia. Electronically Signed   By: Burman NievesWilliam  Stevens M.D.   On: 09-05-17 22:23   Dg Abd Portable 1v  Result Date: 07/19/2017 CLINICAL DATA:  Esophagogastric tube placement. EXAM: PORTABLE ABDOMEN - 1 VIEW COMPARISON:  Abdominal radiograph of July 16, 2017 FINDINGS: An esophagogastric tube is present. Its tip in proximal port project in the gastric body. The bowel gas pattern is normal. IMPRESSION: Reasonable positioning of the esophagogastric tube. Electronically Signed   By: David  [place]azil[place]Jordan M.D.   On: 07/19/2017 12:26   Dg Abd Portable 1v  Result Date: 07/16/2017 CLINICAL DATA:  Nasogastric tube placement. EXAM: PORTABLE ABDOMEN - 1 VIEW COMPARISON:  None. FINDINGS: Nasogastric tube appears adequately positioned in the stomach. Visualized bowel gas pattern is nonobstructive. No evidence of free intraperitoneal air seen. IMPRESSION: Nasogastric tube now appears adequately positioned in the stomach. Proximal side holes are now below the level of the gastroesophageal junction. Electronically Signed   By: Bary RichardStan  Maynard M.D.   On: 07/16/2017 14:35   Dg Abd Portable 1v  Result Date: 07/19/2017 CLINICAL DATA:  NG tube placement EXAM: PORTABLE ABDOMEN - 1 VIEW COMPARISON:  CT 07/10/2017, radiograph 07/14/2017 FINDINGS: Cardiomegaly. Esophageal tube tip projects over the gastric body. Side-port overlies the expected location of GE junction. IMPRESSION: Esophageal tube tip overlies the gastric body. Side-port overlies the expected location of GE junction, could consider further advancement for more  optimal positioning. Electronically Signed   By: Jasmine Pang M.D.   On: 07/11/2017 22:23   [place] Chest Soft Tissue  Result Date: 07/24/2017 CLINICAL DATA:  Left chest lump. Negative left breast biopsy 4-6 months ago. EXAM: ULTRASOUND OF CHEST SOFT TISSUES TECHNIQUE: Ultrasound examination of the chest wall soft tissues was performed in the area of clinical concern. COMPARISON:  None. FINDINGS: Focused  ultrasound in the left anterolateral upper chest wall along the pre axillary line demonstrates a large 6.0 x 4.8 x 2.0 cm heterogeneous fluid collection with areas of internal hypoechogenicity next with anechoic fluid and multiple punctate linear internal echoes. No internal vascularity. There is left anterior chest wall soft tissue swelling inferior to this collection. IMPRESSION: Nonspecific 6.0 cm heterogeneous fluid collection in the left anterolateral upper chest wall. Hematoma or abscess could have this appearance. Correlate with clinical symptoms, physical exam, and laboratory values to exclude infection. Electronically Signed   By: Obie Dredge M.D.   On: 07/24/2017 15:56    Labs:  CBC: Recent Labs    07/25/17 0500 07/27/17 0540 07/29/17 0620 08/01/17 0741  WBC 8.0 7.8 8.9 6.4  HGB 10.1* 10.3* 11.1* 14.7  HCT 29.8* 30.8* 34.1* 45.1  PLT 141* 136* 174 154    COAGS: Recent Labs    08/07/2017 2212  INR 1.27    BMP: Recent Labs    07/25/17 0500 07/27/17 0540 07/29/17 0620 08/01/17 0741  NA 136 132* 132* 133*  K 4.1 4.2 4.5 4.9  CL 95* 93* 91* 90*  CO2 29 26 25 25   GLUCOSE 105* 136* 129* 132*  BUN 44* 40* 38* 56*  CALCIUM 9.4 9.7 9.8 10.2  CREATININE 5.10* 4.23* 3.87* 4.43*  GFRNONAA 8* 10* 11* 9*  GFRAA 9* 11* 13* 11*    LIVER FUNCTION TESTS: Recent Labs    07/16/17 0917  07/25/17 0500 07/27/17 0540 07/29/17 0620 08/01/17 0741  BILITOT 3.7*  --   --   --   --   --   AST 49*  --   --   --   --   --   ALT 19  --   --   --   --   --   ALKPHOS 60  --   --   --   --   --   PROT 6.0*  --   --   --   --   --   ALBUMIN 2.8*   < > 2.5* 2.7* 2.8* 2.8*   < > = values in this interval not displayed.    TUMOR MARKERS: No results for input(s): AFPTM, CEA, CA199, CHROMGRNA in the last 8760 hours.  Assessment and Plan: Dysphagia. Ct abd reviewed, favorable anatomy for perc G-tube. Discussed with daughter request for perc G-tube. She requests swallow study of  some sort. I suggested that her mother may not be able to have a swallow study as she is not really able to follow commands. The daughter requests at least an attempt be made before she is agreeable to G-tube placement. D/w Merril Abbe, PA-C. Will hold on G-tube for now. Given that pt has HD on M-W-F, prefer to do procedure Tues/Thurs, so this Thursday may be reasonable, if family agreeable to proceed.  Thank you for this interesting consult.  I greatly enjoyed meeting Cleona Doubleday and look forward to participating in their care.  A copy of this report was sent to the requesting provider on this date.  Electronically Signed: Brayton El,  PA-C 08/01/2017, 2:52 PM   I spent a total of 25 minutes in face to face in clinical consultation, greater than 50% of which was counseling/coordinating care for dyaphagia/G-tube request.

## 2017-08-01 NOTE — Progress Notes (Signed)
[facility]  ROUNDING NOTE   Subjective:  Patient seen at bedside. Awake this a.m. Patient will be due for dialysis later this morning.  Objective:  Vital signs in last 24 hours:  Temperature 97.0 pulse 84 respirations 20 blood pressure 131/40  Physical Exam: General: No acute distress  Head: Normocephalic, atraumatic. Moist oral mucosal membranes  Eyes: Anicteric  Neck: Supple, trachea midline  Lungs:  Clear to auscultation, normal effort  Heart: S1S2 no rubs  Abdomen:  Soft, nontender, bowel sounds present  Extremities: Trace peripheral edema.  Neurologic: Awake, not consistently following commands.  Skin: No rashes  Access: R IJ PC    Basic Metabolic Panel: Recent Labs  Lab 07/27/17 0540 07/28/17 0521 07/29/17 0620  NA 132*  --  132*  K 4.2  --  4.5  CL 93*  --  91*  CO2 26  --  25  GLUCOSE 136*  --  129*  BUN 40*  --  38*  CREATININE 4.23*  --  3.87*  CALCIUM 9.7  --  9.8  MG 2.1 1.9  --   PHOS 3.3  --  3.1    Liver Function Tests: Recent Labs  Lab 07/27/17 0540 07/29/17 0620  ALBUMIN 2.7* 2.8*   No results for input(s): LIPASE, AMYLASE in the last 168 hours. No results for input(s): AMMONIA in the last 168 hours.  CBC: Recent Labs  Lab 07/27/17 0540 07/29/17 0620  WBC 7.8 8.9  HGB 10.3* 11.1*  HCT 30.8* 34.1*  MCV 85.1 88.3  PLT 136* 174    Cardiac Enzymes: No results for input(s): CKTOTAL, CKMB, CKMBINDEX, TROPONINI in the last 168 hours.  BNP: Invalid input(s): POCBNP  CBG: No results for input(s): GLUCAP in the last 168 hours.  Microbiology: Results for orders placed or performed during the hospital encounter of 07/12/2017  C difficile quick scan w PCR reflex     Status: Abnormal   Collection Time: 07/24/2017  5:57 AM  Result Value Ref Range Status   C Diff antigen POSITIVE (A) NEGATIVE Final   C Diff toxin NEGATIVE NEGATIVE Final   C Diff interpretation Results are indeterminate. See PCR results.  Final    Comment:  Performed at [facility], [address] [place], [place] [address]  C. Diff by PCR, Reflexed     Status: Abnormal   Collection Time: 07/29/2017  5:57 AM  Result Value Ref Range Status   Toxigenic C. Difficile by PCR POSITIVE (A) NEGATIVE Final    Comment: Positive for toxigenic C. difficile with little to no toxin production. Only treat if clinical presentation suggests symptomatic illness. Performed at [facility], [address], [place], [place] [address]     Coagulation Studies: No results for input(s): LABPROT, INR in the last 72 hours.  Urinalysis: No results for input(s): COLORURINE, LABSPEC, PHURINE, GLUCOSEU, HGBUR, BILIRUBINUR, KETONESUR, PROTEINUR, UROBILINOGEN, NITRITE, LEUKOCYTESUR in the last 72 hours.  Invalid input(s): APPERANCEUR    Imaging: Ct Abdomen Wo Contrast  Result Date: 08/01/2017 CLINICAL DATA:  67 year old with dysphasia. Evaluate anatomy for a percutaneous gastrostomy tube placement. EXAM: CT ABDOMEN WITHOUT CONTRAST TECHNIQUE: Multidetector CT imaging of the abdomen was performed following the standard protocol without IV contrast. COMPARISON:  CT 07/10/2017 FINDINGS: Lower chest: Heart is enlarged for size. Central venous catheter tip in the right atrium but incompletely evaluated. Small right pleural effusion with atelectasis in both lower lobes. Extensive subcutaneous edema in the left lower chest and breast region. Hepatobiliary:  No gross abnormality to the liver or gallbladder. There may be a small amount of perihepatic ascites. Pancreas: Unremarkable. No pancreatic ductal dilatation or surrounding inflammatory changes. Spleen: Normal in size without focal abnormality. Adrenals/Urinary Tract: Left nephrectomy. Evidence for cortical thinning in the right kidney with multiple exophytic low densities, suggestive for cysts. Few calcifications scattered throughout the right kidney that may be vascular and likely outside of the collecting system.  Stomach/Bowel: Nasogastric tube terminates in the stomach. The transverse colon is situated caudal to the stomach body. No significant bowel dilatation. Vascular/Lymphatic: The aorta and visceral arteries are heavily calcified. No evidence for an aortic aneurysm. No significant lymph node enlargement in the abdomen. Other: Diffuse subcutaneous edema. Again noted is a catheter fragment along the right anterior abdominal wall. Musculoskeletal: Diffuse sclerosis of the bones findings likely secondary to end-stage renal disease. IMPRESSION: Anatomy is amendable for percutaneous gastrostomy tube placement. Subcutaneous edema with trace perihepatic ascites. Small right pleural effusion. Left nephrectomy.  Right kidney has multiple cysts. Electronically Signed   By: Richarda OverlieAdam  Henn M.D.   On: 08/01/2017 07:37     Medications:       Assessment/ Plan:  67 y.o. 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  1.  ESRD on HD.  Patient was on dialysis on TTS as an outpatient.   -Patient due for dialysis today.  Orders have been prepared.  2.  Anemia of chronic kidney disease.    Hemoglobin remains at target.  Continue to monitor.  3.  Secondary hyperparathyroidism.    Repeat serum phosphorus today.  4.  Recent PEA arrest with ischemic hypoxic encephalopathy.  She is awake and alert but not consistently following commands.  Still confused at times.     LOS: 0 Adler Chartrand 7/22/20198:17 AM

## 2017-08-02 ENCOUNTER — Institutional Professional Consult (permissible substitution) (HOSPITAL_COMMUNITY): Payer: Medicaid Other

## 2017-08-02 LAB — CBC
HCT: 33.9 % — ABNORMAL LOW (ref 36.0–46.0)
Hemoglobin: 11.1 g/dL — ABNORMAL LOW (ref 12.0–15.0)
MCH: 28.5 pg (ref 26.0–34.0)
MCHC: 32.7 g/dL (ref 30.0–36.0)
MCV: 86.9 fL (ref 78.0–100.0)
PLATELETS: 252 10*3/uL (ref 150–400)
RBC: 3.9 MIL/uL (ref 3.87–5.11)
RDW: 20.4 % — AB (ref 11.5–15.5)
WBC: 7.4 10*3/uL (ref 4.0–10.5)

## 2017-08-02 LAB — COMPREHENSIVE METABOLIC PANEL
ALBUMIN: 2.9 g/dL — AB (ref 3.5–5.0)
ALK PHOS: 181 U/L — AB (ref 38–126)
ALT: 25 U/L (ref 0–44)
ANION GAP: 14 (ref 5–15)
AST: 46 U/L — AB (ref 15–41)
BILIRUBIN TOTAL: 2.2 mg/dL — AB (ref 0.3–1.2)
BUN: 33 mg/dL — AB (ref 8–23)
CALCIUM: 10.3 mg/dL (ref 8.9–10.3)
CO2: 25 mmol/L (ref 22–32)
CREATININE: 2.96 mg/dL — AB (ref 0.44–1.00)
Chloride: 97 mmol/L — ABNORMAL LOW (ref 98–111)
GFR calc Af Amer: 18 mL/min — ABNORMAL LOW (ref >60)
GFR calc non Af Amer: 15 mL/min — ABNORMAL LOW (ref >60)
GLUCOSE: 112 mg/dL — AB (ref 70–99)
Potassium: 4.4 mmol/L (ref 3.5–5.1)
Sodium: 136 mmol/L (ref 135–145)
Total Protein: 7.3 g/dL (ref 6.5–8.1)

## 2017-08-02 LAB — TSH: TSH: 9.221 u[IU]/mL — AB (ref 0.350–4.500)

## 2017-08-03 LAB — CBC
HCT: 33 % — ABNORMAL LOW (ref 36.0–46.0)
Hemoglobin: 10.9 g/dL — ABNORMAL LOW (ref 12.0–15.0)
MCH: 28.7 pg (ref 26.0–34.0)
MCHC: 33 g/dL (ref 30.0–36.0)
MCV: 86.8 fL (ref 78.0–100.0)
PLATELETS: 300 10*3/uL (ref 150–400)
RBC: 3.8 MIL/uL — ABNORMAL LOW (ref 3.87–5.11)
RDW: 20.3 % — AB (ref 11.5–15.5)
WBC: 9 10*3/uL (ref 4.0–10.5)

## 2017-08-03 LAB — RENAL FUNCTION PANEL
ALBUMIN: 2.7 g/dL — AB (ref 3.5–5.0)
Anion gap: 16 — ABNORMAL HIGH (ref 5–15)
BUN: 46 mg/dL — AB (ref 8–23)
CO2: 23 mmol/L (ref 22–32)
Calcium: 10.5 mg/dL — ABNORMAL HIGH (ref 8.9–10.3)
Chloride: 96 mmol/L — ABNORMAL LOW (ref 98–111)
Creatinine, Ser: 3.81 mg/dL — ABNORMAL HIGH (ref 0.44–1.00)
GFR calc Af Amer: 13 mL/min — ABNORMAL LOW (ref >60)
GFR, EST NON AFRICAN AMERICAN: 11 mL/min — AB (ref >60)
GLUCOSE: 124 mg/dL — AB (ref 70–99)
Phosphorus: 2.3 mg/dL — ABNORMAL LOW (ref 2.5–4.6)
Potassium: 4.7 mmol/L (ref 3.5–5.1)
Sodium: 135 mmol/L (ref 135–145)

## 2017-08-03 NOTE — Progress Notes (Addendum)
Patient ID: Sara MaskerMargaret Rice Alexander, female   DOB: 04-Nov-1950, 67 y.o.   MRN: [id number]   Failed swallow study Husband now willing to move ahead with G tube  Percutaneous gastric tube placement Risks and benefits discussed with the patient's husband via phone including, but not limited to the need for a barium enema during the procedure, bleeding, infection, peritonitis, or damage to adjacent structures.  All of the patient's husbands questions were answered, patient is agreeable to proceed. Consent signed and in chart.

## 2017-08-03 NOTE — Progress Notes (Signed)
[facility]  ROUNDING NOTE   Subjective:  Patient resting comfortably in bed. She is arousable and will follow a few simple commands.  Remains confused.  Objective:  Vital signs in last 24 hours:  Temperature 96.5 pulse 88 respirations 28 blood pressure 125/59  Physical Exam: General: No acute distress  Head: Normocephalic, atraumatic. Moist oral mucosal membranes  Eyes: Anicteric  Neck: Supple, trachea midline  Lungs:  Clear to auscultation, normal effort  Heart: S1S2 no rubs  Abdomen:  Soft, nontender, bowel sounds present  Extremities: Trace peripheral edema.  Neurologic: Awake, not consistently following commands.  Skin: No rashes  Access: R IJ PC    Basic Metabolic Panel: Recent Labs  Lab 07/28/17 0521  07/29/17 0620 08/01/17 0741 08/02/17 0747 08/03/17 0621  NA  --   --  132* 133* 136 135  K  --   --  4.5 4.9 4.4 4.7  CL  --   --  91* 90* 97* 96*  CO2  --   --  25 25 25 23   GLUCOSE  --   --  129* 132* 112* 124*  BUN  --   --  38* 56* 33* 46*  CREATININE  --   --  3.87* 4.43* 2.96* 3.81*  CALCIUM  --    < > 9.8 10.2 10.3 10.5*  MG 1.9  --   --   --   --   --   PHOS  --   --  3.1 2.8  --  2.3*   < > = values in this interval not displayed.    Liver Function Tests: Recent Labs  Lab 07/29/17 0620 08/01/17 0741 08/02/17 0747 08/03/17 0621  AST  --   --  46*  --   ALT  --   --  25  --   ALKPHOS  --   --  181*  --   BILITOT  --   --  2.2*  --   PROT  --   --  7.3  --   ALBUMIN 2.8* 2.8* 2.9* 2.7*   No results for input(s): LIPASE, AMYLASE in the last 168 hours. No results for input(s): AMMONIA in the last 168 hours.  CBC: Recent Labs  Lab 07/29/17 0620 08/01/17 0741 08/02/17 0747 08/03/17 0621  WBC 8.9 6.4 7.4 9.0  HGB 11.1* 14.7 11.1* 10.9*  HCT 34.1* 45.1 33.9* 33.0*  MCV 88.3 86.1 86.9 86.8  PLT 174 154 252 300    Cardiac Enzymes: No results for input(s): CKTOTAL, CKMB, CKMBINDEX, TROPONINI in the last 168  hours.  BNP: Invalid input(s): POCBNP  CBG: No results for input(s): GLUCAP in the last 168 hours.  Microbiology: Results for orders placed or performed during the hospital encounter of 07/28/2017  C difficile quick scan w PCR reflex     Status: Abnormal   Collection Time: 08/07/2017  5:57 AM  Result Value Ref Range Status   C Diff antigen POSITIVE (A) NEGATIVE Final   C Diff toxin NEGATIVE NEGATIVE Final   C Diff interpretation Results are indeterminate. See PCR results.  Final    Comment: Performed at [facility], [address] [place], [place] [address]  C. Diff by PCR, Reflexed     Status: Abnormal   Collection Time: 07/12/2017  5:57 AM  Result Value Ref Range Status   Toxigenic C. Difficile by PCR POSITIVE (A) NEGATIVE Final    Comment: Positive for toxigenic C. difficile with little to no toxin  production. Only treat if clinical presentation suggests symptomatic illness. Performed at [facility], [address] [place], [place] [address]     Coagulation Studies: No results for input(s): LABPROT, INR in the last 72 hours.  Urinalysis: No results for input(s): COLORURINE, LABSPEC, PHURINE, GLUCOSEU, HGBUR, BILIRUBINUR, KETONESUR, PROTEINUR, UROBILINOGEN, NITRITE, LEUKOCYTESUR in the last 72 hours.  Invalid input(s): APPERANCEUR    Imaging: No results found.   Medications:       Assessment/ Plan:  67 y.o. 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  1.  ESRD on HD.  Patient was on dialysis on TTS as an outpatient.   -Patient seen and evaluated at bedside.  Due for dialysis again today.  Thereafter we will plan for dialysis again on  Friday.  2.  Anemia of chronic kidney disease.    Hemoglobin currently 10.9.  Patient not requiring Procrit.  3.  Secondary hyperparathyroidism.    Phosphorus 2.3 and acceptable.  Continue to monitor.  4.  Recent PEA arrest with ischemic hypoxic encephalopathy.  Mental status not significantly changed.  She is arousable and will follow commands periodically.     LOS: 0 Brynlee Pennywell 7/24/20198:56 AM

## 2017-08-04 ENCOUNTER — Encounter (HOSPITAL_COMMUNITY): Payer: Self-pay | Admitting: Diagnostic Radiology

## 2017-08-04 ENCOUNTER — Other Ambulatory Visit (HOSPITAL_COMMUNITY): Payer: Medicaid Other

## 2017-08-04 HISTORY — PX: IR FLUORO RM 30-60 MIN: IMG2384

## 2017-08-04 LAB — PROTIME-INR
INR: 1.21
Prothrombin Time: 15.2 seconds (ref 11.4–15.2)

## 2017-08-04 MED ORDER — MIDAZOLAM HCL 2 MG/2ML IJ SOLN
INTRAMUSCULAR | Status: AC
  Filled 2017-08-04: qty 2

## 2017-08-04 MED ORDER — IOPAMIDOL (ISOVUE-300) INJECTION 61%
INTRAVENOUS | Status: AC
  Filled 2017-08-04: qty 50

## 2017-08-04 MED ORDER — CEFAZOLIN SODIUM-DEXTROSE 2-4 GM/100ML-% IV SOLN
INTRAVENOUS | Status: AC
  Filled 2017-08-04: qty 100

## 2017-08-04 MED ORDER — GLUCAGON HCL RDNA (DIAGNOSTIC) 1 MG IJ SOLR
INTRAMUSCULAR | Status: AC
  Filled 2017-08-04: qty 1

## 2017-08-04 MED ORDER — LIDOCAINE HCL 1 % IJ SOLN
INTRAMUSCULAR | Status: AC
  Filled 2017-08-04: qty 20

## 2017-08-04 MED ORDER — FENTANYL CITRATE (PF) 100 MCG/2ML IJ SOLN
INTRAMUSCULAR | Status: AC
  Filled 2017-08-04: qty 2

## 2017-08-04 NOTE — Sedation Documentation (Signed)
Patient has low no peripheral perfusion. Patient oxygen saturation not reading. This RN and anoth RN have tried in multiple locations with different types of monitors. Patient also mouth breathing and unable to get accurate ETCO2 reading. Patient agitated and confused. MD aware

## 2017-08-04 NOTE — Sedation Documentation (Signed)
After multiple attempts by this RN, another RN, and a CRNA to achieve vitals on patient unsuccessfully procedure cancelled by MD. Report called and patient being transferred. No meds given

## 2017-08-05 LAB — CBC
HEMATOCRIT: 32 % — AB (ref 36.0–46.0)
HEMOGLOBIN: 10.2 g/dL — AB (ref 12.0–15.0)
MCH: 28 pg (ref 26.0–34.0)
MCHC: 31.9 g/dL (ref 30.0–36.0)
MCV: 87.9 fL (ref 78.0–100.0)
Platelets: 300 10*3/uL (ref 150–400)
RBC: 3.64 MIL/uL — AB (ref 3.87–5.11)
RDW: 20.5 % — AB (ref 11.5–15.5)
WBC: 11 10*3/uL — AB (ref 4.0–10.5)

## 2017-08-05 LAB — RENAL FUNCTION PANEL
ALBUMIN: 2.5 g/dL — AB (ref 3.5–5.0)
ANION GAP: 15 (ref 5–15)
BUN: 41 mg/dL — ABNORMAL HIGH (ref 8–23)
CALCIUM: 10.1 mg/dL (ref 8.9–10.3)
CO2: 23 mmol/L (ref 22–32)
Chloride: 98 mmol/L (ref 98–111)
Creatinine, Ser: 3.75 mg/dL — ABNORMAL HIGH (ref 0.44–1.00)
GFR calc non Af Amer: 11 mL/min — ABNORMAL LOW (ref >60)
GFR, EST AFRICAN AMERICAN: 13 mL/min — AB (ref >60)
Glucose, Bld: 116 mg/dL — ABNORMAL HIGH (ref 70–99)
PHOSPHORUS: 3.1 mg/dL (ref 2.5–4.6)
POTASSIUM: 4.7 mmol/L (ref 3.5–5.1)
SODIUM: 136 mmol/L (ref 135–145)

## 2017-08-05 LAB — AMYLASE: Amylase: 78 U/L (ref 28–100)

## 2017-08-05 LAB — LIPASE, BLOOD: Lipase: 63 U/L — ABNORMAL HIGH (ref 11–51)

## 2017-08-05 NOTE — Progress Notes (Signed)
[facility]  ROUNDING NOTE   Subjective:  Patient Sitting up in the chair Eyes are closed Did not answer any questions   Objective:  Vital signs in last 24 hours:  Temperature 97.7, pulse 93, respirations 30, blood pressure 109/47  Physical Exam: General: No acute distress  Head: NG tube in place. Moist oral mucosal membranes  Eyes: Anicteric  Neck: Supple, trachea midline  Lungs:  Coarse breath sounds bilaterally, normal effort  Heart: S1S2 no rubs, 3/6 Systolic murmur  Abdomen:  Soft, nontender  Extremities: Trace peripheral edema.  Neurologic: not following commands.  Skin: No rashes  Access: R IJ PC    Basic Metabolic Panel: Recent Labs  Lab 08/01/17 0741 08/02/17 0747 08/03/17 0621 08/05/17 0651  NA 133* 136 135 136  K 4.9 4.4 4.7 4.7  CL 90* 97* 96* 98  CO2 25 25 23 23   GLUCOSE 132* 112* 124* 116*  BUN 56* 33* 46* 41*  CREATININE 4.43* 2.96* 3.81* 3.75*  CALCIUM 10.2 10.3 10.5* 10.1  PHOS 2.8  --  2.3* 3.1    Liver Function Tests: Recent Labs  Lab 08/01/17 0741 08/02/17 0747 08/03/17 0621 08/05/17 0651  AST  --  46*  --   --   ALT  --  25  --   --   ALKPHOS  --  181*  --   --   BILITOT  --  2.2*  --   --   PROT  --  7.3  --   --   ALBUMIN 2.8* 2.9* 2.7* 2.5*   Recent Labs  Lab 08/05/17 0651  LIPASE 63*  AMYLASE 78   No results for input(s): AMMONIA in the last 168 hours.  CBC: Recent Labs  Lab 08/01/17 0741 08/02/17 0747 08/03/17 0621 08/05/17 0651  WBC 6.4 7.4 9.0 11.0*  HGB 14.7 11.1* 10.9* 10.2*  HCT 45.1 33.9* 33.0* 32.0*  MCV 86.1 86.9 86.8 87.9  PLT 154 252 300 300    Cardiac Enzymes: No results for input(s): CKTOTAL, CKMB, CKMBINDEX, TROPONINI in the last 168 hours.  BNP: Invalid input(s): POCBNP  CBG: No results for input(s): GLUCAP in the last 168 hours.  Microbiology: Results for orders placed or performed during the hospital encounter of Nov 21, 2017  C difficile quick scan w PCR reflex     Status:  Abnormal   Collection Time: Nov 21, 2017  5:57 AM  Result Value Ref Range Status   C Diff antigen POSITIVE (A) NEGATIVE Final   C Diff toxin NEGATIVE NEGATIVE Final   C Diff interpretation Results are indeterminate. See PCR results.  Final    Comment: Performed at [facility], [address] [place], [place] [address]  C. Diff by PCR, Reflexed     Status: Abnormal   Collection Time: Nov 21, 2017  5:57 AM  Result Value Ref Range Status   Toxigenic C. Difficile by PCR POSITIVE (A) NEGATIVE Final    Comment: Positive for toxigenic C. difficile with little to no toxin production. Only treat if clinical presentation suggests symptomatic illness. Performed at [facility], [address] [place], [place] [address]     Coagulation Studies: Recent Labs    08/04/17 0741  LABPROT 15.2  INR 1.21    Urinalysis: No results for input(s): COLORURINE, LABSPEC, PHURINE, GLUCOSEU, HGBUR, BILIRUBINUR, KETONESUR, PROTEINUR, UROBILINOGEN, NITRITE, LEUKOCYTESUR in the last 72 hours.  Invalid input(s): APPERANCEUR    Imaging: Ir Fluoro Rm 30-60 Min  Result Date: 08/04/2017 CLINICAL DATA:  67 year old  with dysphagia and failed swallowing test. Patient was scheduled for a percutaneous gastrostomy tube placement. EXAM: IR FLOURO RM 0-60 MIN ANESTHESIA/SEDATION: None MEDICATIONS: None CONTRAST:  None PROCEDURE: Orogastric tube was placed under fluoroscopy. The tube was placed within the stomach. Oxygen saturation could not be accurately measured on this patient despite placing a monitor all over the patient's body. In addition, anesthesia placed on monitor on the patient's forehead but a consistent waveform and oxygen saturation could not be obtained. Moderate sedation could not be performed because a consistent oxygen saturation could not be monitored. Orogastric tube was removed. COMPLICATIONS: None immediate FINDINGS: Orogastric tube advanced into the stomach and removed once the procedure was  canceled. IMPRESSION: Percutaneous gastrostomy tube was canceled because patient cannot be safely sedated at this time. Patient's oxygen saturation levels could not be accurately monitored during a procedure. Percutaneous gastrostomy tube placement is currently on hold. Electronically Signed   By: Richarda Overlie M.D.   On: 08/04/2017 17:27     Medications:       Assessment/ Plan:  67 y.o. 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  1.  ESRD on HD.  Patient was on dialysis on TTS as an outpatient.   -Patient seen and evaluated at bedside.   Completed dialysis treatment earlier today.  2.1 L removed  2.  Anemia of chronic kidney disease.    Hemoglobin currently 10.2.  Patient not requiring Procrit.  3.  Secondary hyperparathyroidism.    Phosphorus 3.1 and acceptable.  Continue to monitor.  4.  Recent PEA arrest with ischemic hypoxic encephalopathy.  Mental status not significantly changed.      LOS: 0 Tityana Pagan Thedore Mins 7/26/20193:48 PM

## 2017-08-07 ENCOUNTER — Other Ambulatory Visit (HOSPITAL_COMMUNITY): Payer: Medicaid Other

## 2017-08-08 LAB — CBC
HEMATOCRIT: 30.5 % — AB (ref 36.0–46.0)
Hemoglobin: 9.9 g/dL — ABNORMAL LOW (ref 12.0–15.0)
MCH: 27.7 pg (ref 26.0–34.0)
MCHC: 32.5 g/dL (ref 30.0–36.0)
MCV: 85.4 fL (ref 78.0–100.0)
PLATELETS: 279 10*3/uL (ref 150–400)
RBC: 3.57 MIL/uL — ABNORMAL LOW (ref 3.87–5.11)
RDW: 19.1 % — AB (ref 11.5–15.5)
WBC: 13.5 10*3/uL — ABNORMAL HIGH (ref 4.0–10.5)

## 2017-08-08 LAB — RENAL FUNCTION PANEL
Albumin: 2.4 g/dL — ABNORMAL LOW (ref 3.5–5.0)
Anion gap: 15 (ref 5–15)
BUN: 56 mg/dL — AB (ref 8–23)
CHLORIDE: 97 mmol/L — AB (ref 98–111)
CO2: 24 mmol/L (ref 22–32)
CREATININE: 4.74 mg/dL — AB (ref 0.44–1.00)
Calcium: 10.1 mg/dL (ref 8.9–10.3)
GFR calc Af Amer: 10 mL/min — ABNORMAL LOW (ref >60)
GFR calc non Af Amer: 9 mL/min — ABNORMAL LOW (ref >60)
GLUCOSE: 149 mg/dL — AB (ref 70–99)
POTASSIUM: 4.6 mmol/L (ref 3.5–5.1)
Phosphorus: 2.8 mg/dL (ref 2.5–4.6)
Sodium: 136 mmol/L (ref 135–145)

## 2017-08-08 MED FILL — Medication: Qty: 1 | Status: AC

## 2017-08-11 DEATH — deceased

## 2019-08-05 IMAGING — DX DG ABD PORTABLE 1V
1 series · 1 of 1 positions shown · non-contrast
Comparison: None.

CLINICAL DATA: Nasogastric tube placement.

EXAM:
PORTABLE ABDOMEN - 1 VIEW

[abdomen]
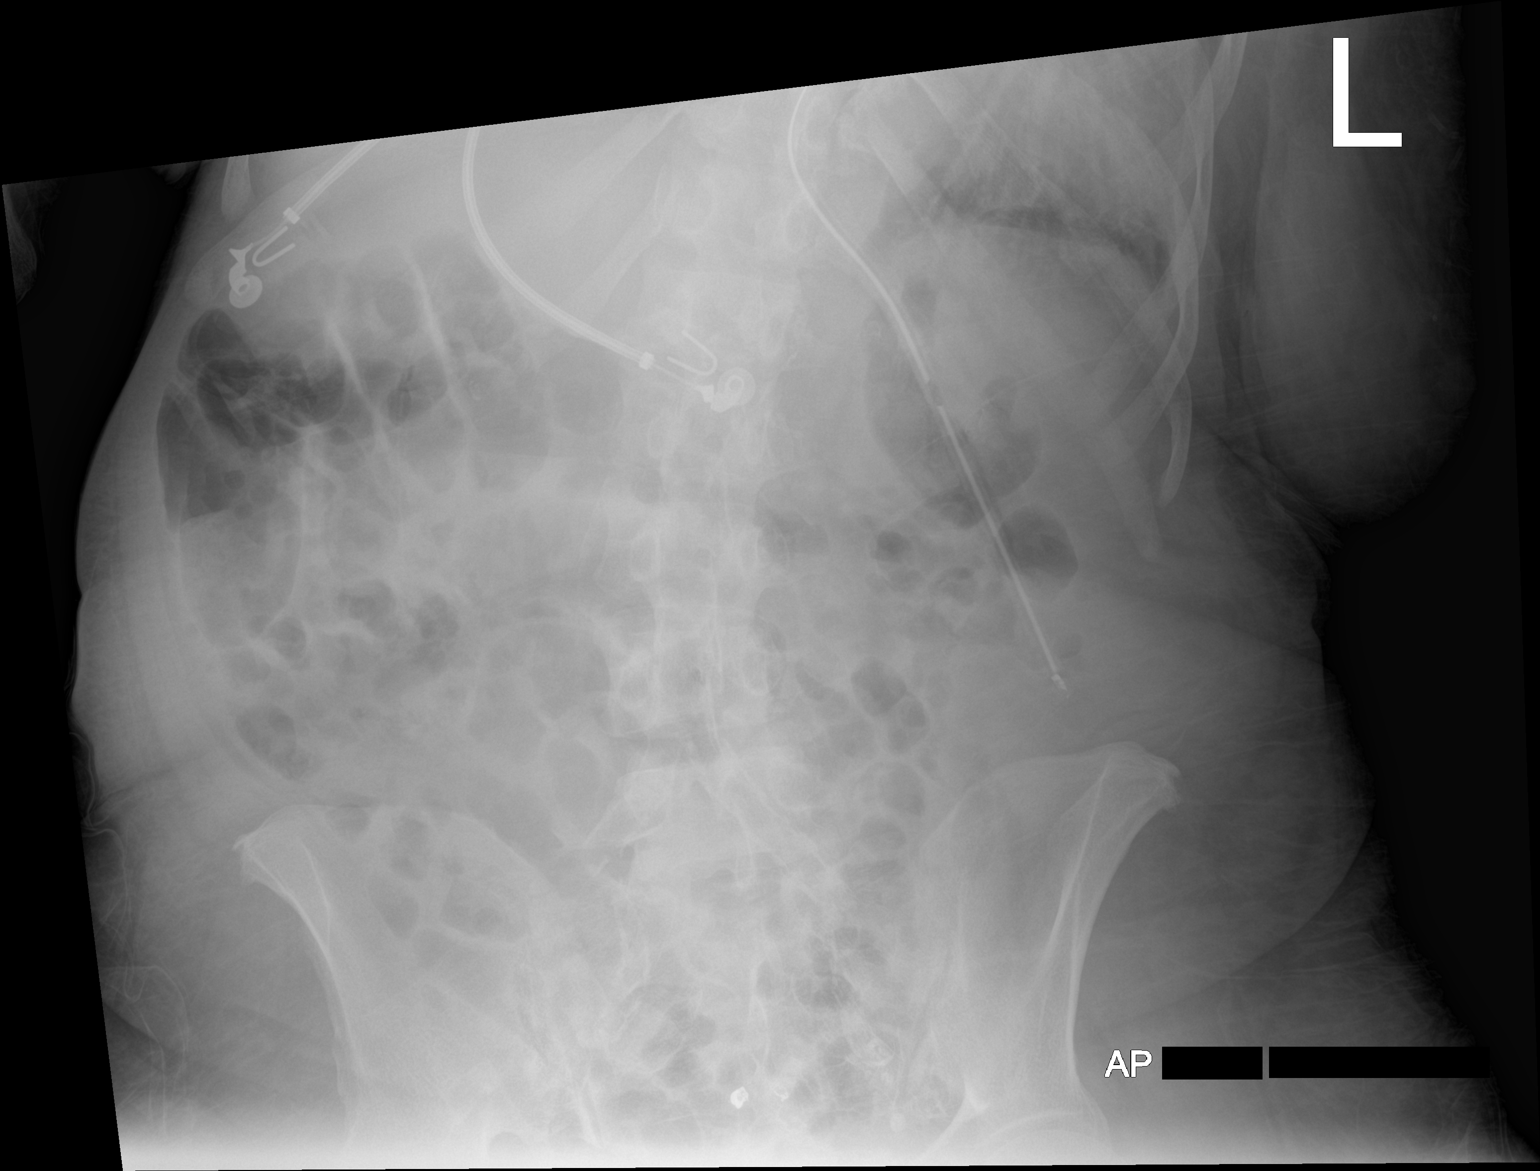

[1 of 1 positions shown; findings below may reference images not displayed]

FINDINGS: Nasogastric tube appears adequately positioned in the stomach.
Visualized bowel gas pattern is nonobstructive. No evidence of free
intraperitoneal air seen.
IMPRESSION: Nasogastric tube now appears adequately positioned in the stomach.
Proximal side holes are now below the level of the gastroesophageal
junction.

## 2019-08-08 IMAGING — DX DG ABD PORTABLE 1V
1 series · 1 of 1 positions shown · non-contrast
Comparison: Abdominal radiograph of July 16, 2017

CLINICAL DATA: Esophagogastric tube placement.

EXAM:
PORTABLE ABDOMEN - 1 VIEW

[abdomen kub]
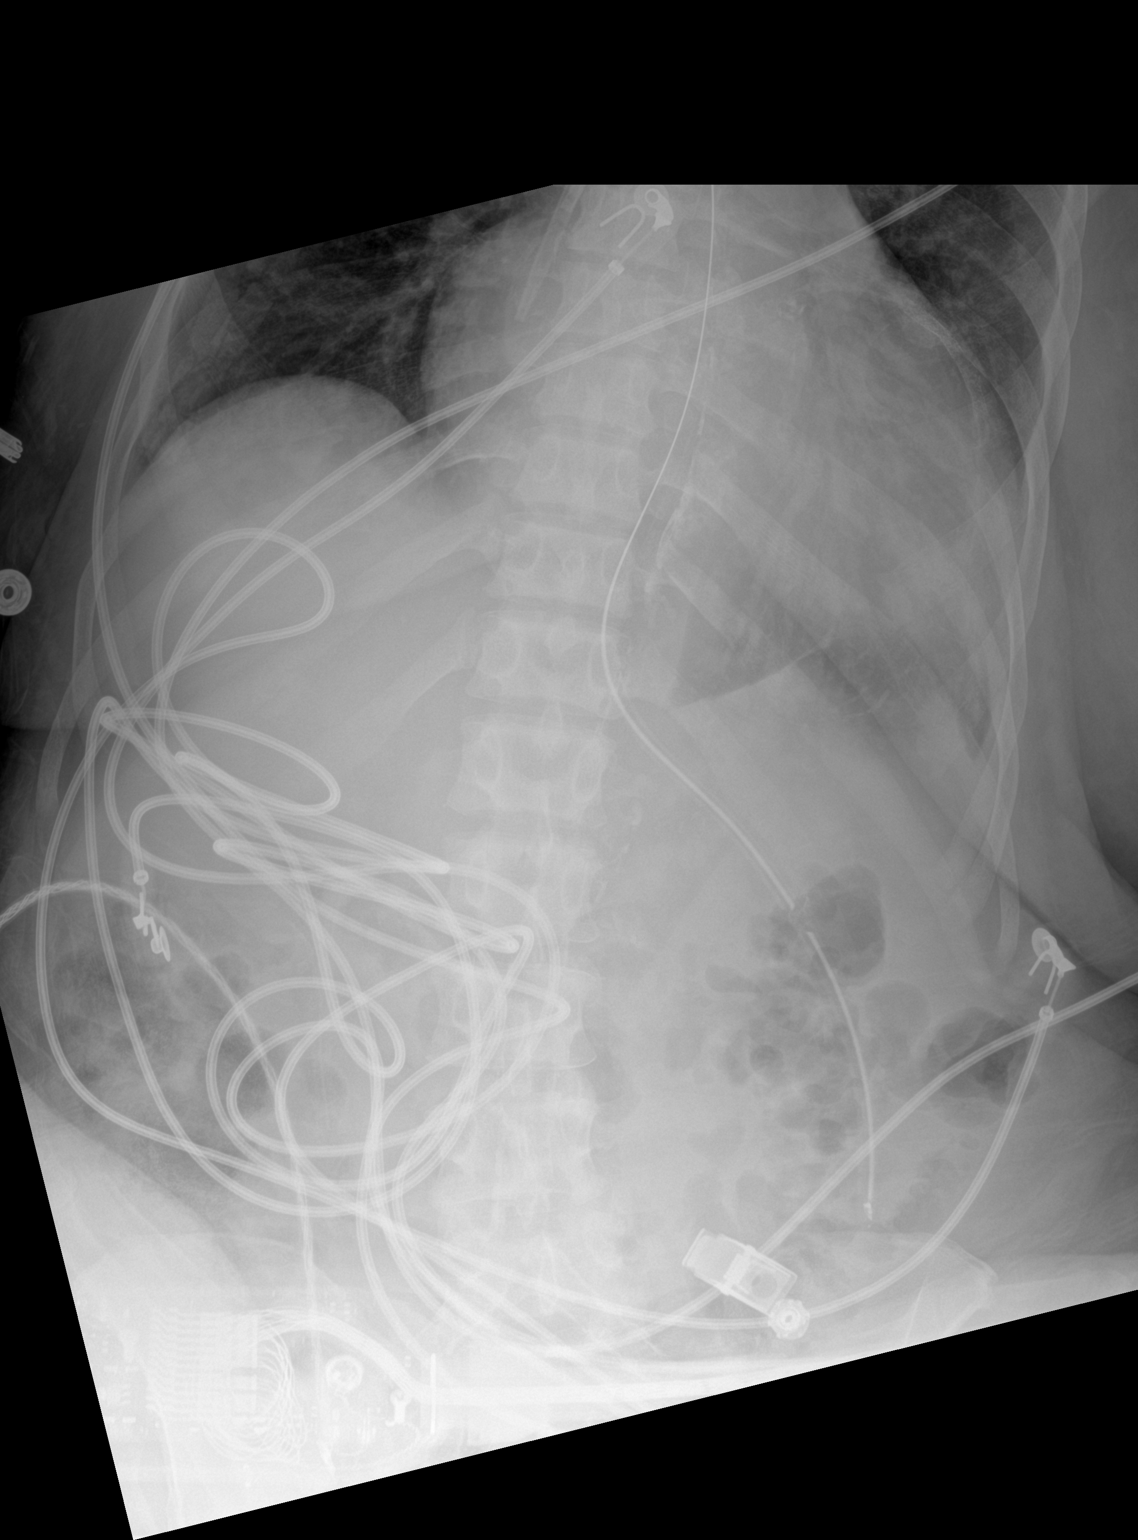

[1 of 1 positions shown; findings below may reference images not displayed]

FINDINGS: An esophagogastric tube is present. Its tip in proximal port project
in the gastric body. The bowel gas pattern is normal.
IMPRESSION: Reasonable positioning of the esophagogastric tube.

## 2019-08-20 IMAGING — CT CT ABDOMEN W/O CM
2 of 4 series · 16 of 46 positions shown, 18 images · non-contrast
Comparison: CT 07/10/2017

CLINICAL DATA: 67-year-old with dysphasia. Evaluate anatomy for a
percutaneous gastrostomy tube placement.

EXAM:
CT ABDOMEN WITHOUT CONTRAST
TECHNIQUE: Multidetector CT imaging of the abdomen was performed following the
standard protocol without IV contrast.

[Series 8: a/p w/o 5mm · axial · non-contrast · 0.98mm/px · z∈[+1002,+1237]mm · 13 of 53 slices shown, 15 images]
[im 3/53  soft-tissue]
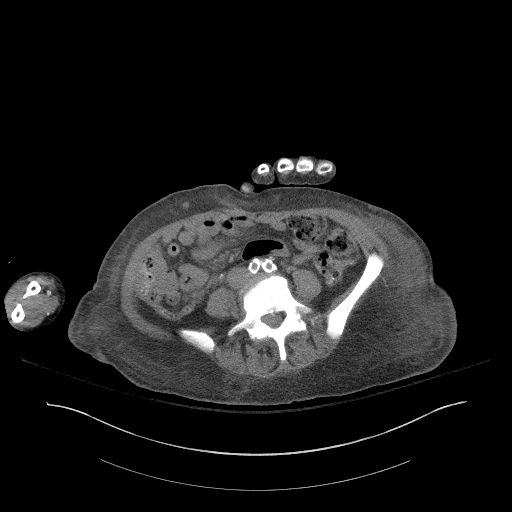
[im 3/53  bone]
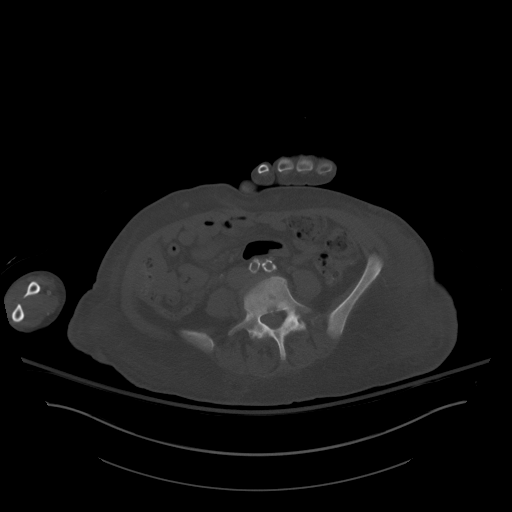
[im 9/53  soft-tissue]
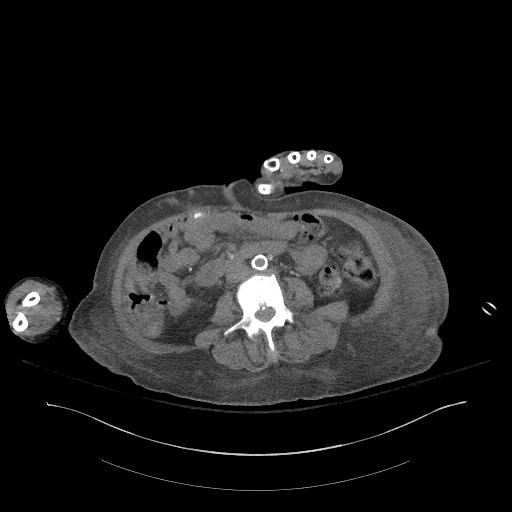
[im 11/53  soft-tissue]
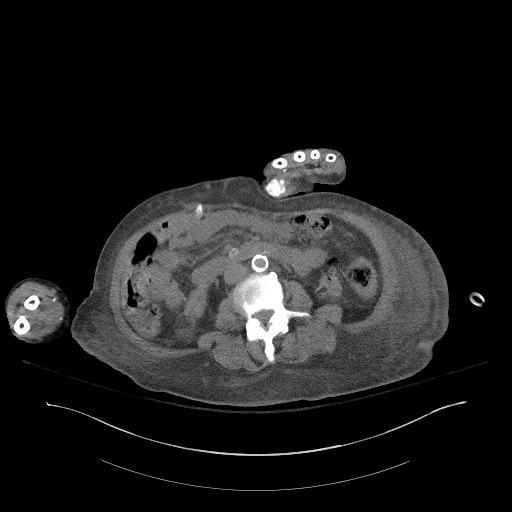
[im 14/53  soft-tissue]
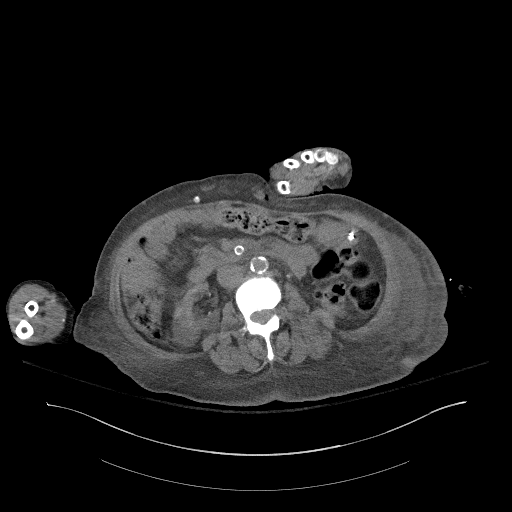
[im 20/53  soft-tissue]
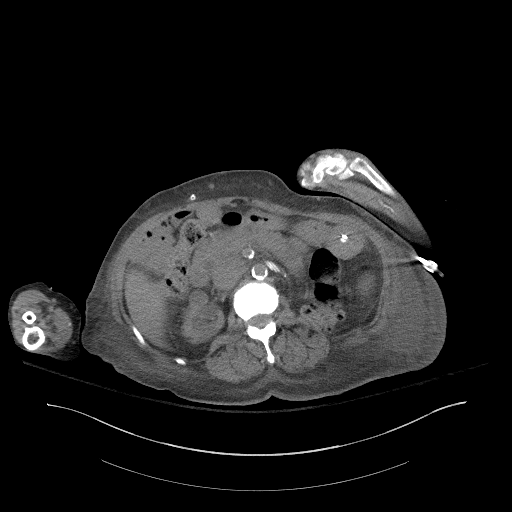
[im 22/53  soft-tissue]
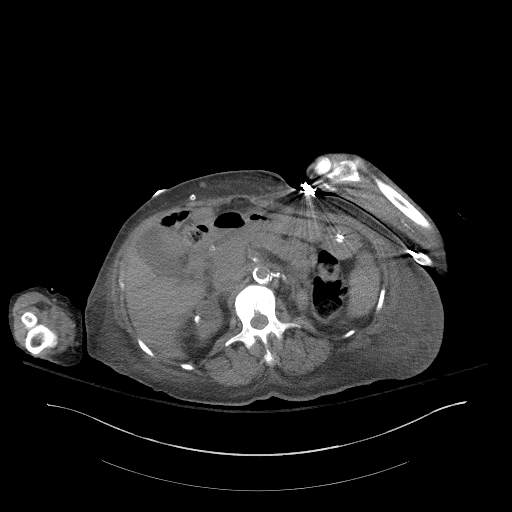
[im 28/53  soft-tissue]
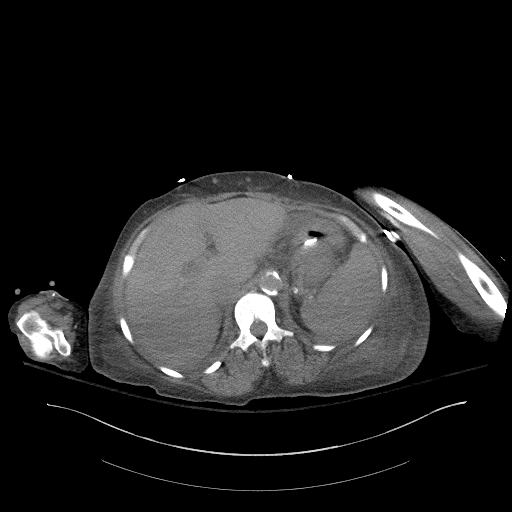
[im 31/53  soft-tissue]
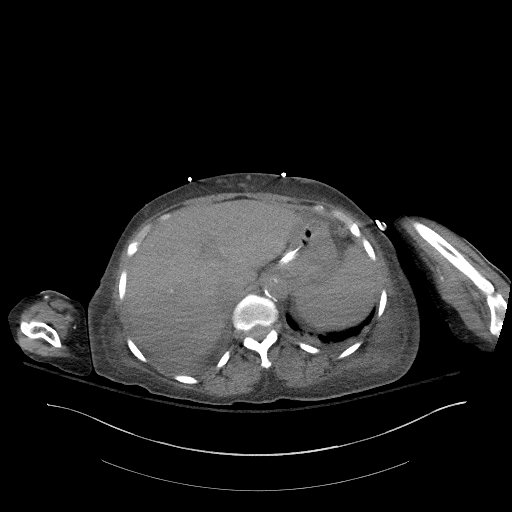
[im 33/53  soft-tissue]
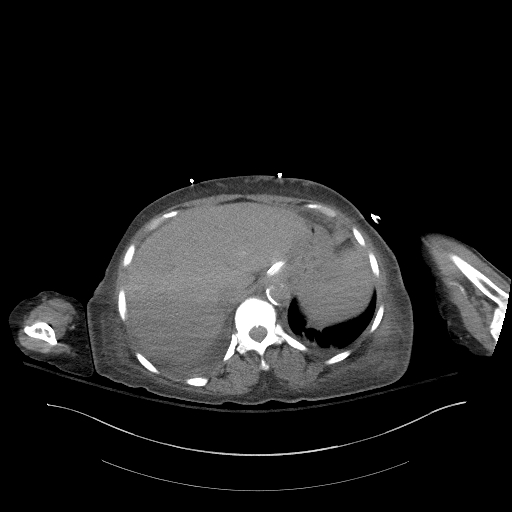
[im 33/53  bone]
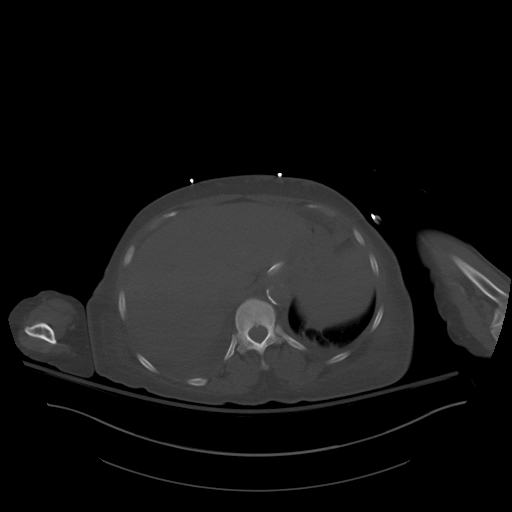
[im 39/53  soft-tissue]
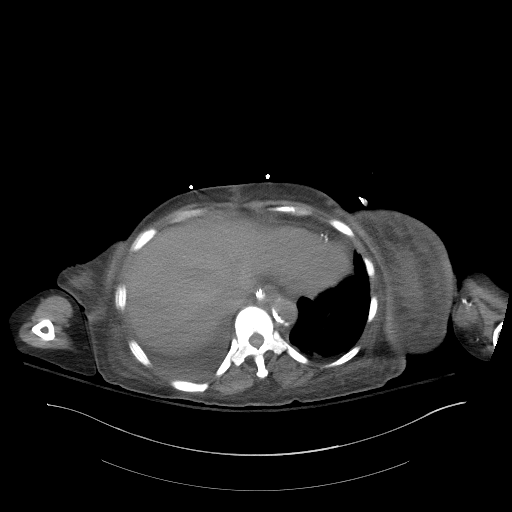
[im 42/53  soft-tissue]
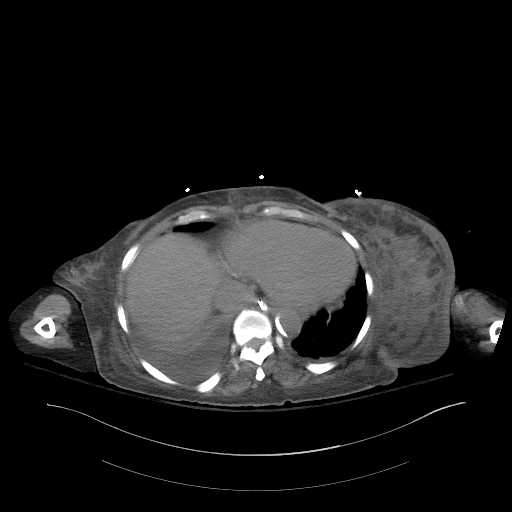
[im 44/53  soft-tissue]
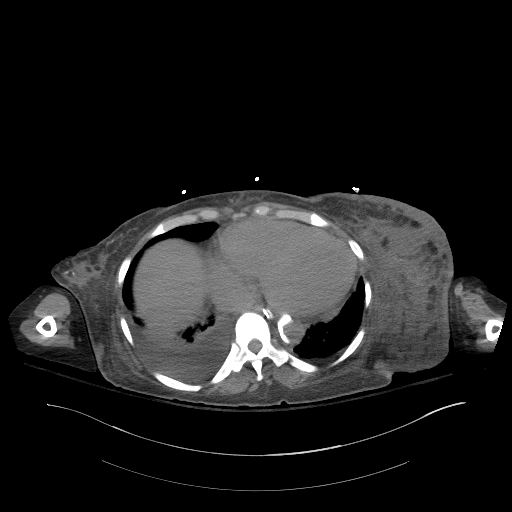
[im 50/53  soft-tissue]
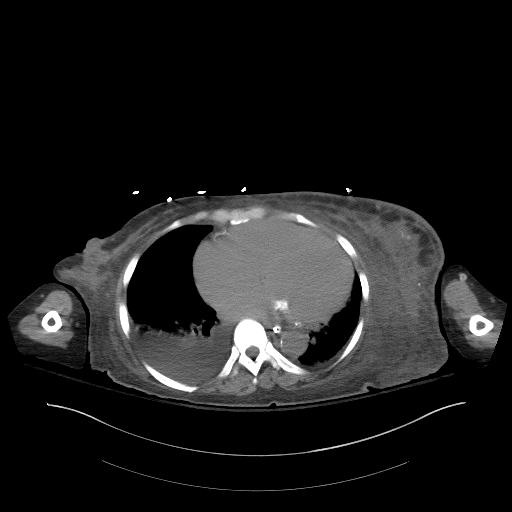

[Series 9: a/p w/o cor · coronal · non-contrast · 0.60mm/px · 3 of 135 slices shown]
[im 45/135  soft-tissue]
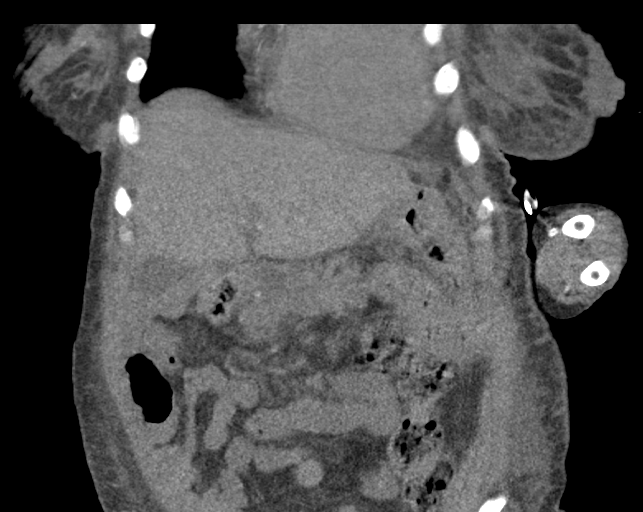
[im 60/135  soft-tissue]
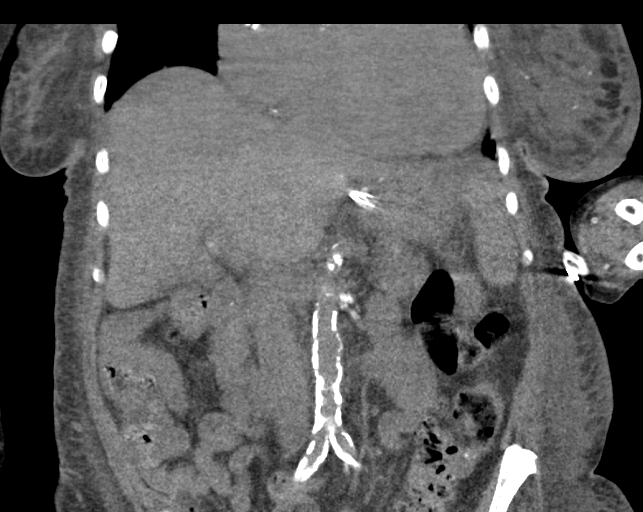
[im 75/135  soft-tissue]
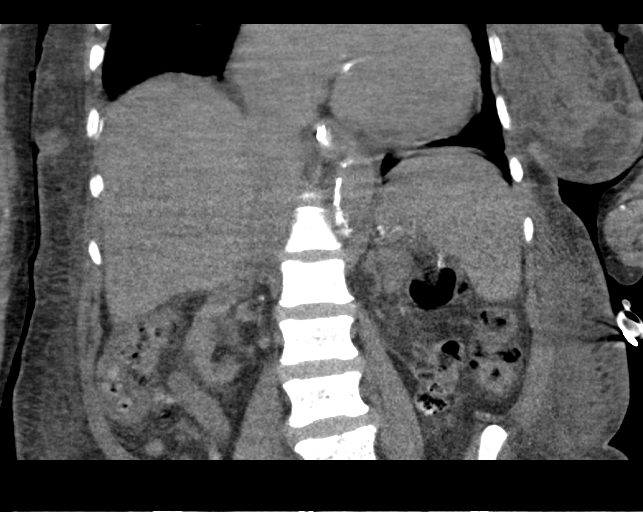

[16 of 46 positions shown; findings below may reference images not displayed]

FINDINGS: Lower chest: Heart is enlarged for size. Central venous catheter tip
in the right atrium but incompletely evaluated. Small right pleural
effusion with atelectasis in both lower lobes. Extensive
subcutaneous edema in the left lower chest and breast region.

Hepatobiliary: No gross abnormality to the liver or gallbladder.
There may be a small amount of perihepatic ascites.

Pancreas: Unremarkable. No pancreatic ductal dilatation or
surrounding inflammatory changes.

Spleen: Normal in size without focal abnormality.

Adrenals/Urinary Tract: Left nephrectomy. Evidence for cortical
thinning in the right kidney with multiple exophytic low densities,
suggestive for cysts. Few calcifications scattered throughout the
right kidney that may be vascular and likely outside of the
collecting system.

Stomach/Bowel: Nasogastric tube terminates in the stomach. The
transverse colon is situated caudal to the stomach body. No
significant bowel dilatation.

Vascular/Lymphatic: The aorta and visceral arteries are heavily
calcified. No evidence for an aortic aneurysm. No significant lymph
node enlargement in the abdomen.

Other: Diffuse subcutaneous edema. Again noted is a catheter
fragment along the right anterior abdominal wall.

Musculoskeletal: Diffuse sclerosis of the bones findings likely
secondary to end-stage renal disease.
IMPRESSION: Anatomy is amendable for percutaneous gastrostomy tube placement.

Subcutaneous edema with trace perihepatic ascites. Small right
pleural effusion.

Left nephrectomy.  Right kidney has multiple cysts.
